# Patient Record
Sex: Male | Born: 1940 | Race: White | Hispanic: No | Smoking: Former smoker
Health system: Southern US, Community
[De-identification: ages and names within clinical notes are randomized; demographics above are authoritative.]

## PROBLEM LIST (undated history)

## (undated) DIAGNOSIS — C801 Malignant (primary) neoplasm, unspecified: Secondary | ICD-10-CM

## (undated) DIAGNOSIS — E785 Hyperlipidemia, unspecified: Secondary | ICD-10-CM

## (undated) DIAGNOSIS — H269 Unspecified cataract: Secondary | ICD-10-CM

## (undated) DIAGNOSIS — R413 Other amnesia: Secondary | ICD-10-CM

## (undated) HISTORY — PX: MELANOMA EXCISION: SHX5266

## (undated) HISTORY — DX: Malignant (primary) neoplasm, unspecified: C80.1

## (undated) HISTORY — DX: Hyperlipidemia, unspecified: E78.5

## (undated) HISTORY — DX: Unspecified cataract: H26.9

## (undated) HISTORY — DX: Other amnesia: R41.3

## (undated) HISTORY — PX: EYE SURGERY: SHX253

---

## 2002-07-21 ENCOUNTER — Ambulatory Visit (HOSPITAL_COMMUNITY): Admission: RE | Admit: 2002-07-21 | Discharge: 2002-07-21 | Payer: Self-pay | Admitting: Internal Medicine

## 2008-04-21 HISTORY — PX: EYE SURGERY: SHX253

## 2008-10-25 ENCOUNTER — Ambulatory Visit (HOSPITAL_COMMUNITY): Admission: RE | Admit: 2008-10-25 | Discharge: 2008-10-25 | Payer: Self-pay | Admitting: Ophthalmology

## 2008-12-14 ENCOUNTER — Ambulatory Visit (HOSPITAL_COMMUNITY): Admission: RE | Admit: 2008-12-14 | Discharge: 2008-12-14 | Payer: Self-pay | Admitting: Ophthalmology

## 2008-12-14 DIAGNOSIS — H269 Unspecified cataract: Secondary | ICD-10-CM

## 2008-12-14 HISTORY — DX: Unspecified cataract: H26.9

## 2010-07-28 LAB — HEMOGLOBIN AND HEMATOCRIT, BLOOD
HCT: 43.3 % (ref 39.0–52.0)
Hemoglobin: 15.6 g/dL (ref 13.0–17.0)

## 2010-07-28 LAB — BASIC METABOLIC PANEL
GFR calc non Af Amer: >60 mL/min (ref >60)
Glucose, Bld: 95 mg/dL (ref 70–99)
Potassium: 3.8 mEq/L (ref 3.5–5.1)
Sodium: 136 mEq/L (ref 135–145)

## 2010-09-06 NOTE — Op Note (Signed)
   NAME:  David Guerrero, David Guerrero                           ACCOUNT NO.:  [id number]   MEDICAL RECORD NO.:  [id number]                   PATIENT TYPE:  AMB   LOCATION:  DAY                                  FACILITY:  [facility]   PHYSICIAN:  R. Roetta Sessions, M.D.              DATE OF BIRTH:  May 21, 1940   DATE OF PROCEDURE:  07/21/2002  DATE OF DISCHARGE:                                 OPERATIVE REPORT   PROCEDURE:  Screening colonoscopy.   ENDOSCOPIST:  Gerrit Friends. Rourk, M.D.   INDICATIONS FOR PROCEDURE:  The patient is a 70 year old gentleman referred  for colorectal cancer screening.  He has never had a colonoscopy, although  he may have had a sigmoidoscopy in [place], [place] previously.  He is  devoid of any lower GI tract symptoms.  No family history of colorectal  neoplasia.  Colonoscopy is now being done as a standard screening maneuver.  This approach has been discussed with the patient at length.  The patient  potential risks, benefits and alternatives have been reviewed, questions  answered.  He is agreeable.  Please see the documentation in the medical  record.   MONITORING:  O2 saturation, blood pressure, pulse and respirations were  monitored throughout the entire procedure.  Conscious sedation: IV Versed and Demerol in incremental doses.   INSTRUMENT:  Olympus video chip colonoscope.   FINDINGS:  Digital rectal exam revealed no abnormalities.   ENDOSCOPIC FINDINGS:  The prep was good.   RECTUM:  Examination of the rectal mucosa including the retroflex view of  the anal verge revealed minimal internal hemorrhoids only.   COLON:  The colonic mucosa was surveyed from the rectosigmoid junction  through the left transverse and right colon to the area of the appendiceal  orifice, ileocecal valve, and cecum.  These structures were well seen and  photographed for the record.  The colonic mucosa to the cecum appeared  normal.   From the level of the cecum and ileocecal valve the  scope was slowly  withdrawn.  All previously mentioned mucosal surfaces were again seen; and,  again, no abnormalities were observed.  The patient tolerated the procedure  well and was reacted in endoscopy.    IMPRESSION:  1. Minimal internal hemorrhoids, otherwise normal rectum.  2. Normal colon.   RECOMMENDATIONS:  Repeat colonoscopy in 10 years.                                               Jonathon Bellows, M.D.    RMR/MEDQ  D:  07/21/2002  T:  07/21/2002  Job:  [id number]   cc:   [address]  [place]  [place] [address]  Fax: [phone]

## 2012-10-14 DIAGNOSIS — Z85828 Personal history of other malignant neoplasm of skin: Secondary | ICD-10-CM | POA: Insufficient documentation

## 2012-10-14 DIAGNOSIS — L570 Actinic keratosis: Secondary | ICD-10-CM | POA: Insufficient documentation

## 2013-10-17 DIAGNOSIS — L570 Actinic keratosis: Secondary | ICD-10-CM | POA: Diagnosis not present

## 2013-10-17 DIAGNOSIS — Z85820 Personal history of malignant melanoma of skin: Secondary | ICD-10-CM | POA: Diagnosis not present

## 2013-10-17 DIAGNOSIS — L738 Other specified follicular disorders: Secondary | ICD-10-CM | POA: Diagnosis not present

## 2013-10-17 DIAGNOSIS — L821 Other seborrheic keratosis: Secondary | ICD-10-CM | POA: Diagnosis not present

## 2013-10-17 DIAGNOSIS — D239 Other benign neoplasm of skin, unspecified: Secondary | ICD-10-CM | POA: Diagnosis not present

## 2013-10-17 DIAGNOSIS — I781 Nevus, non-neoplastic: Secondary | ICD-10-CM | POA: Diagnosis not present

## 2013-10-17 DIAGNOSIS — L819 Disorder of pigmentation, unspecified: Secondary | ICD-10-CM | POA: Diagnosis not present

## 2014-01-10 DIAGNOSIS — Z23 Encounter for immunization: Secondary | ICD-10-CM | POA: Diagnosis not present

## 2014-02-03 DIAGNOSIS — D692 Other nonthrombocytopenic purpura: Secondary | ICD-10-CM | POA: Diagnosis not present

## 2014-02-03 DIAGNOSIS — Z872 Personal history of diseases of the skin and subcutaneous tissue: Secondary | ICD-10-CM | POA: Diagnosis not present

## 2014-02-03 DIAGNOSIS — Z85820 Personal history of malignant melanoma of skin: Secondary | ICD-10-CM | POA: Diagnosis not present

## 2014-03-13 DIAGNOSIS — E784 Other hyperlipidemia: Secondary | ICD-10-CM | POA: Diagnosis not present

## 2014-03-13 DIAGNOSIS — Z79899 Other long term (current) drug therapy: Secondary | ICD-10-CM | POA: Diagnosis not present

## 2014-03-13 DIAGNOSIS — Z125 Encounter for screening for malignant neoplasm of prostate: Secondary | ICD-10-CM | POA: Diagnosis not present

## 2014-03-14 DIAGNOSIS — Z79899 Other long term (current) drug therapy: Secondary | ICD-10-CM | POA: Diagnosis not present

## 2014-03-14 DIAGNOSIS — Z125 Encounter for screening for malignant neoplasm of prostate: Secondary | ICD-10-CM | POA: Diagnosis not present

## 2014-03-14 DIAGNOSIS — E784 Other hyperlipidemia: Secondary | ICD-10-CM | POA: Diagnosis not present

## 2014-03-21 DIAGNOSIS — E785 Hyperlipidemia, unspecified: Secondary | ICD-10-CM | POA: Diagnosis not present

## 2014-03-21 DIAGNOSIS — Z0000 Encounter for general adult medical examination without abnormal findings: Secondary | ICD-10-CM | POA: Diagnosis not present

## 2014-08-29 DIAGNOSIS — H04123 Dry eye syndrome of bilateral lacrimal glands: Secondary | ICD-10-CM | POA: Diagnosis not present

## 2014-08-29 DIAGNOSIS — H26492 Other secondary cataract, left eye: Secondary | ICD-10-CM | POA: Diagnosis not present

## 2014-08-29 DIAGNOSIS — D3131 Benign neoplasm of right choroid: Secondary | ICD-10-CM | POA: Diagnosis not present

## 2014-08-29 DIAGNOSIS — Z961 Presence of intraocular lens: Secondary | ICD-10-CM | POA: Diagnosis not present

## 2014-10-30 DIAGNOSIS — D229 Melanocytic nevi, unspecified: Secondary | ICD-10-CM | POA: Insufficient documentation

## 2014-10-30 DIAGNOSIS — Z85828 Personal history of other malignant neoplasm of skin: Secondary | ICD-10-CM | POA: Diagnosis not present

## 2014-10-30 DIAGNOSIS — Z85820 Personal history of malignant melanoma of skin: Secondary | ICD-10-CM | POA: Diagnosis not present

## 2014-10-30 DIAGNOSIS — D239 Other benign neoplasm of skin, unspecified: Secondary | ICD-10-CM | POA: Diagnosis not present

## 2014-10-30 DIAGNOSIS — L570 Actinic keratosis: Secondary | ICD-10-CM | POA: Diagnosis not present

## 2014-11-15 ENCOUNTER — Telehealth: Payer: Self-pay | Admitting: Family Medicine

## 2014-11-15 NOTE — Telephone Encounter (Signed)
Patient has medicare and bcbs. Patient is currently on lipitor. Patient will call us back around the 8th of August since he would prefer to see Dr. Evette Doffing and her schedule is not open yet. Patient is able to schedule and is accepted.

## 2014-12-13 ENCOUNTER — Ambulatory Visit: Payer: Self-pay | Admitting: Pediatrics

## 2014-12-15 ENCOUNTER — Encounter: Payer: Self-pay | Admitting: Pediatrics

## 2014-12-15 ENCOUNTER — Ambulatory Visit (INDEPENDENT_AMBULATORY_CARE_PROVIDER_SITE_OTHER): Payer: Medicare Other | Admitting: Pediatrics

## 2014-12-15 ENCOUNTER — Encounter (INDEPENDENT_AMBULATORY_CARE_PROVIDER_SITE_OTHER): Payer: Self-pay

## 2014-12-15 VITALS — BP 151/76 | HR 53 | Temp 97.5°F | Ht 70.5 in | Wt 199.8 lb

## 2014-12-15 DIAGNOSIS — E785 Hyperlipidemia, unspecified: Secondary | ICD-10-CM | POA: Diagnosis not present

## 2014-12-15 DIAGNOSIS — Z0000 Encounter for general adult medical examination without abnormal findings: Secondary | ICD-10-CM | POA: Diagnosis not present

## 2014-12-15 DIAGNOSIS — Z136 Encounter for screening for cardiovascular disorders: Secondary | ICD-10-CM | POA: Diagnosis not present

## 2014-12-15 DIAGNOSIS — Z87891 Personal history of nicotine dependence: Secondary | ICD-10-CM | POA: Diagnosis not present

## 2014-12-15 DIAGNOSIS — Z125 Encounter for screening for malignant neoplasm of prostate: Secondary | ICD-10-CM | POA: Diagnosis not present

## 2014-12-15 DIAGNOSIS — IMO0001 Reserved for inherently not codable concepts without codable children: Secondary | ICD-10-CM

## 2014-12-15 DIAGNOSIS — Z5181 Encounter for therapeutic drug level monitoring: Secondary | ICD-10-CM

## 2014-12-15 DIAGNOSIS — R030 Elevated blood-pressure reading, without diagnosis of hypertension: Secondary | ICD-10-CM

## 2014-12-15 DIAGNOSIS — Z6828 Body mass index (BMI) 28.0-28.9, adult: Secondary | ICD-10-CM | POA: Diagnosis not present

## 2014-12-15 MED ORDER — ATORVASTATIN CALCIUM 40 MG PO TABS: 40.0000 mg | ORAL_TABLET | Freq: Every day | ORAL | Status: DC

## 2014-12-15 NOTE — Assessment & Plan Note (Deleted)
--  Colorectal Cancer Screening: has had normal colonoscopy sometime in last 10-12 years, he is not sure when or if he is due. --Zostavax: due, pt declined --Pneumococcal Vaccines: due --PSA: has had yearly, none elevated. No urinary symptoms, has a steady stream, complete bladder emptying. --AAA Screening: never done

## 2014-12-15 NOTE — Progress Notes (Signed)
Subjective:  Establish care  Pt Here for establish care.  Overall feeling well, no complaints. Only medicines are aspirin for primary prevention and atorvastatin for HLD. He was tried on several other statins in the past and this is the only one that has been effective, he denies side effects or myopathies with trial of other drugs.   He started walking daily 10 months ago, has walked over 1055mles since then, lost 20 lbs. Eating a balanced diet, high in fruits and vegetables. Minimizing red meats and controlling portion sizes. 0-1 glass of wine with dinner.  Sees a dermatologist regularly for skin checks. 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  SH: He lives at home with his wife who is in fairly good health, recent gall bladder surgery.  HCM --Colorectal Cancer Screening: has had normal colonoscopy at some point since moving here in 2003. He is not sure when or if he is due. --Zostavax: due, pt declined --Pneumococcal Vaccines: never had one, due for prevnar now --PSA: has had yearly, none elevated. No urinary symptoms, has a full steady stream, complete bladder emptying. --AAA Screening: never done, due 15-20 pack year history  Review of Systems  Constitutional: Negative for fever.       Purposeful weight loss  HENT: Negative for congestion and hearing loss.   Eyes: Negative for blurred vision.  Respiratory: Negative for cough, shortness of breath and wheezing.   Cardiovascular: Negative for chest pain, palpitations, orthopnea, claudication and leg swelling.  Gastrointestinal: Negative for abdominal pain, constipation and blood in stool.  Genitourinary: Negative for frequency.  Musculoskeletal: Negative for myalgias and falls.  Skin: Negative for rash.  Neurological: Negative for headaches.  Endo/Heme/Allergies: Does not bruise/bleed easily.  Psychiatric/Behavioral: Negative for depression.     Past Medical  History Patient Active Problem List   Diagnosis Date Noted  . Hyperlipidemia 12/15/2014  . Health care maintenance 12/15/2014    Medications- reviewed and updated Current Outpatient Prescriptions  Medication Sig Dispense Refill  . aspirin 81 MG tablet Take 81 mg by mouth daily.    .Marland Kitchenatorvastatin (LIPITOR) 40 MG tablet Take 1 tablet (40 mg total) by mouth daily at 6 PM. 90 tablet 3   No current facility-administered medications for this visit.    Objective: BP 151/76 mmHg  Pulse 53  Temp(Src) 97.5 F (36.4 C) (Oral)  Ht 5' 10.5" (1.791 m)  Wt 199 lb 12.8 oz (90.629 kg)  BMI 28.25 kg/m2 Gen: NAD, alert, cooperative with exam HEENT: NCAT, EOMI, TMs pearly gray b/l, OP without erythema CV: NRRR, normal S1/S2, no murmur, DP pulses 2+ b/l Resp: CTABL, no wheezes, normal WOB Abd: +BS, soft, NTND. no guarding or organomegaly Ext: No edema, warm Neuro: Alert and oriented, strength equal b/l UE and LE, coodrination grossly normal MSK: normal muscle bulk   Assessment/Plan:  73yoM with h/o HLD presents to establish care.   Hyperlipidemia Refill atorvastatin, check lipid profile and LFTs today.  -     Lipid panel -     atorvastatin (LIPITOR) 40 MG tablet; Take 1 tablet (40 mg total) by mouth daily at 6 PM.  Encounter for medication monitoring On atorvastatin, no LFTs in system. -     Hepatic function panel  Elevated BP Per pt usually 130s-140s/70s. No symptoms of elevated BP. -     BMP8+EGFR  Prostate cancer screening encounter, options and risks discussed Pt opted to not have  PSA screening this visit. Has had PSA tested in the past, no history of elevated values and pt with no BPH symptoms.  BMI 28.0-28.9,adult Lost 20 lbs over the past 10 months, walking several miles a day, healthy nutrition choices. Continue current exercise/nutrition.  HCM: --Colorectal Cancer Screening: obtain records --Zostavax: declined --Pneumococcal Vaccines: due, ordered --PSA: declined --AAA  Screening: due, 15-20 pack year history, ordered ultrasound  Other orders -     Pneumococcal conjugate vaccine 13-valent  [name], MD [facility] 12/15/2014, 12:24 PM

## 2014-12-15 NOTE — Assessment & Plan Note (Signed)
Refill atorvastatin, check lipid profile and LFTs today.

## 2014-12-16 LAB — BMP8+EGFR
BUN/Creatinine Ratio: 17 (ref 10–22)
BUN: 16 mg/dL (ref 8–27)
CALCIUM: 9.3 mg/dL (ref 8.6–10.2)
CHLORIDE: 102 mmol/L (ref 97–108)
CO2: 23 mmol/L (ref 18–29)
Creatinine, Ser: 0.93 mg/dL (ref 0.76–1.27)
GFR, EST AFRICAN AMERICAN: 93 mL/min/{1.73_m2} (ref >59)
GFR, EST NON AFRICAN AMERICAN: 81 mL/min/{1.73_m2} (ref >59)
Glucose: 89 mg/dL (ref 65–99)
Potassium: 3.9 mmol/L (ref 3.5–5.2)
Sodium: 141 mmol/L (ref 134–144)

## 2014-12-16 LAB — HEPATIC FUNCTION PANEL
ALBUMIN: 4.4 g/dL (ref 3.5–4.8)
ALT: 17 IU/L (ref 0–44)
AST: 23 IU/L (ref 0–40)
Alkaline Phosphatase: 48 IU/L (ref 39–117)
BILIRUBIN TOTAL: 0.9 mg/dL (ref 0.0–1.2)
Bilirubin, Direct: 0.22 mg/dL (ref 0.00–0.40)
Total Protein: 6.6 g/dL (ref 6.0–8.5)

## 2014-12-16 LAB — LIPID PANEL
Chol/HDL Ratio: 2.6 ratio units (ref 0.0–5.0)
Cholesterol, Total: 181 mg/dL (ref 100–199)
HDL: 69 mg/dL (ref >39)
LDL Calculated: 85 mg/dL (ref 0–99)
TRIGLYCERIDES: 137 mg/dL (ref 0–149)
VLDL CHOLESTEROL CAL: 27 mg/dL (ref 5–40)

## 2014-12-18 ENCOUNTER — Other Ambulatory Visit: Payer: Self-pay

## 2014-12-18 ENCOUNTER — Other Ambulatory Visit: Payer: Self-pay | Admitting: Pediatrics

## 2014-12-18 DIAGNOSIS — I714 Abdominal aortic aneurysm, without rupture, unspecified: Secondary | ICD-10-CM

## 2014-12-18 DIAGNOSIS — F17200 Nicotine dependence, unspecified, uncomplicated: Secondary | ICD-10-CM

## 2014-12-18 NOTE — Addendum Note (Signed)
Addended by: Eustaquio Maize on: 12/18/2014 12:49 PM   Modules accepted: Orders

## 2014-12-19 ENCOUNTER — Other Ambulatory Visit: Payer: Self-pay | Admitting: Nurse Practitioner

## 2014-12-19 DIAGNOSIS — E785 Hyperlipidemia, unspecified: Secondary | ICD-10-CM

## 2014-12-19 MED ORDER — ATORVASTATIN CALCIUM 40 MG PO TABS: 40.0000 mg | ORAL_TABLET | Freq: Every day | ORAL | Status: DC

## 2014-12-20 ENCOUNTER — Telehealth: Payer: Self-pay | Admitting: Pediatrics

## 2014-12-20 NOTE — Telephone Encounter (Signed)
Patient states he was not aware of needing an ultra sound to check for a AAA. Patient states that he does not want to have it done at this time.

## 2014-12-21 NOTE — Telephone Encounter (Signed)
I called him back to explain. We will defer AAA screen for now.

## 2014-12-27 ENCOUNTER — Ambulatory Visit (HOSPITAL_COMMUNITY): Admission: RE | Admit: 2014-12-27 | Payer: Self-pay | Source: Ambulatory Visit

## 2015-01-13 DIAGNOSIS — Z23 Encounter for immunization: Secondary | ICD-10-CM | POA: Diagnosis not present

## 2015-01-18 ENCOUNTER — Telehealth: Payer: Self-pay | Admitting: Pediatrics

## 2015-01-18 NOTE — Telephone Encounter (Signed)
David Guerrero spoke with patient.

## 2015-01-23 ENCOUNTER — Ambulatory Visit (INDEPENDENT_AMBULATORY_CARE_PROVIDER_SITE_OTHER): Payer: Medicare Other | Admitting: *Deleted

## 2015-01-23 ENCOUNTER — Telehealth: Payer: Self-pay | Admitting: Pediatrics

## 2015-01-23 DIAGNOSIS — Z23 Encounter for immunization: Secondary | ICD-10-CM

## 2015-01-23 NOTE — Patient Instructions (Signed)

## 2015-01-23 NOTE — Progress Notes (Signed)
Pt given Prevnar 13 IM LUOQ and tolerated well.

## 2015-04-30 DIAGNOSIS — Z87898 Personal history of other specified conditions: Secondary | ICD-10-CM | POA: Diagnosis not present

## 2015-04-30 DIAGNOSIS — D1730 Benign lipomatous neoplasm of skin and subcutaneous tissue of unspecified sites: Secondary | ICD-10-CM | POA: Diagnosis not present

## 2015-04-30 DIAGNOSIS — D229 Melanocytic nevi, unspecified: Secondary | ICD-10-CM | POA: Diagnosis not present

## 2015-04-30 DIAGNOSIS — I781 Nevus, non-neoplastic: Secondary | ICD-10-CM | POA: Diagnosis not present

## 2015-04-30 DIAGNOSIS — L821 Other seborrheic keratosis: Secondary | ICD-10-CM | POA: Diagnosis not present

## 2015-04-30 DIAGNOSIS — L570 Actinic keratosis: Secondary | ICD-10-CM | POA: Diagnosis not present

## 2015-04-30 DIAGNOSIS — L814 Other melanin hyperpigmentation: Secondary | ICD-10-CM | POA: Diagnosis not present

## 2015-05-14 ENCOUNTER — Ambulatory Visit (INDEPENDENT_AMBULATORY_CARE_PROVIDER_SITE_OTHER): Payer: Medicare Other | Admitting: Pediatrics

## 2015-05-14 ENCOUNTER — Encounter: Payer: Self-pay | Admitting: Pediatrics

## 2015-05-14 VITALS — BP 150/81 | HR 58 | Temp 97.3°F | Ht 70.5 in | Wt 204.4 lb

## 2015-05-14 DIAGNOSIS — J309 Allergic rhinitis, unspecified: Secondary | ICD-10-CM

## 2015-05-14 DIAGNOSIS — H6121 Impacted cerumen, right ear: Secondary | ICD-10-CM

## 2015-05-14 MED ORDER — FLUTICASONE PROPIONATE 50 MCG/ACT NA SUSP: 2.0000 | Freq: Every day | NASAL | Status: DC

## 2015-05-14 NOTE — Progress Notes (Signed)
    Subjective:    Patient ID: David Guerrero, male    DOB: 10-28-1940, 75 y.o.   MRN: [id number]  CC: ear stuffiness  HPI: David Guerrero is a 75 y.o. male presenting for ear problem  Ongoing for about a week Feels like both ears are stopped up Everything sounds a little fuzzy Has had chronic slightly runny nose for months No sneezing  HTN: 135/75 or so at home, never in the 150s  Depression screen [facility] 2/9 05/14/2015 12/15/2014  Decreased Interest 0 0  Down, Depressed, Hopeless 0 0  PHQ - 2 Score 0 0    Relevant past medical, surgical, family and social history reviewed and updated as indicated. Interim medical history since our last visit reviewed. Allergies and medications reviewed and updated.   ROS: Per HPI unless specifically indicated above  History  Smoking status  . Former Smoker -- 0.50 packs/day for 10 years  . Types: Cigarettes  . Quit date: 12/11/1978  Smokeless tobacco  . Never Used    Past Medical History Patient Active Problem List   Diagnosis Date Noted  . Hyperlipidemia 12/15/2014  . Health care maintenance 12/15/2014    Current Outpatient Prescriptions  Medication Sig Dispense Refill  . aspirin 81 MG tablet Take 81 mg by mouth daily.    Marland Kitchen atorvastatin (LIPITOR) 40 MG tablet Take 1 tablet (40 mg total) by mouth daily at 6 PM. 90 tablet 3  . fluticasone (FLONASE) 50 MCG/ACT nasal spray Place 2 sprays into both nostrils daily. 16 g 6   No current facility-administered medications for this visit.       Objective:    BP 150/81 mmHg  Pulse 58  Temp(Src) 97.3 F (36.3 C) (Oral)  Ht 5' 10.5" (1.791 m)  Wt 204 lb 6.4 oz (92.715 kg)  BMI 28.90 kg/m2  Wt Readings from Last 3 Encounters:  05/14/15 204 lb 6.4 oz (92.715 kg)  12/15/14 199 lb 12.8 oz (90.629 kg)    Gen: NAD, alert, cooperative with exam, NCAT EYES: EOMI, no scleral injection or icterus ENT:  L TM slightly dull, non-erythematous, slightly splayed LR, R TM with impacted cerumen.  After cerumen removal R ear slightly erythematous, slightly splayed LR. OP without erythema LYMPH: no cervical LAD CV: NRRR, normal S1/S2, no murmur, distal pulses 2+ b/l Resp: CTABL, no wheezes, normal WOB Abd: +BS, soft, NTND. no guarding or organomegaly Ext: No edema, warm Neuro: Alert and oriented, strength equal b/l UE and LE, coordination grossly normal MSK: normal muscle bulk     Assessment & Plan:    David Guerrero was seen today for ear pain.  Diagnoses and all orders for this visit:  Allergic rhinitis, unspecified allergic rhinitis type -     fluticasone (FLONASE) 50 MCG/ACT nasal spray; Place 2 sprays into both nostrils daily.  Impacted cerumen of right ear   Impacted cerumen removal: After procedure described to patient and patient agreed with proceeding, cerumen impaction was removed from both ears using irrigation. Pt tolerated procedure well.    Follow up plan: Return in about 6 months (around 11/11/2015).  [name], MD [facility] 05/14/2015, 2:27 PM

## 2015-05-14 NOTE — Patient Instructions (Signed)
flonase two sprays each side once a day

## 2015-06-11 ENCOUNTER — Telehealth: Payer: Self-pay | Admitting: Pediatrics

## 2015-06-11 DIAGNOSIS — J309 Allergic rhinitis, unspecified: Secondary | ICD-10-CM

## 2015-06-11 MED ORDER — TRIAMCINOLONE ACETONIDE 55 MCG/ACT NA AERO: 2.0000 | INHALATION_SPRAY | Freq: Every day | NASAL | Status: DC

## 2015-06-11 NOTE — Telephone Encounter (Signed)
flonase helped a lot, hates the taste, sent in nasacort.

## 2015-09-11 DIAGNOSIS — L03032 Cellulitis of left toe: Secondary | ICD-10-CM | POA: Diagnosis not present

## 2015-09-20 DIAGNOSIS — Z961 Presence of intraocular lens: Secondary | ICD-10-CM | POA: Diagnosis not present

## 2015-09-20 DIAGNOSIS — H04123 Dry eye syndrome of bilateral lacrimal glands: Secondary | ICD-10-CM | POA: Diagnosis not present

## 2015-09-20 DIAGNOSIS — H26492 Other secondary cataract, left eye: Secondary | ICD-10-CM | POA: Diagnosis not present

## 2015-09-20 DIAGNOSIS — D3131 Benign neoplasm of right choroid: Secondary | ICD-10-CM | POA: Diagnosis not present

## 2015-09-27 DIAGNOSIS — L03032 Cellulitis of left toe: Secondary | ICD-10-CM | POA: Diagnosis not present

## 2015-12-18 ENCOUNTER — Encounter: Payer: Self-pay | Admitting: Pediatrics

## 2015-12-18 ENCOUNTER — Ambulatory Visit (INDEPENDENT_AMBULATORY_CARE_PROVIDER_SITE_OTHER): Payer: Medicare Other | Admitting: Pediatrics

## 2015-12-18 VITALS — BP 126/72 | HR 66 | Temp 97.4°F | Ht 70.5 in | Wt 196.6 lb

## 2015-12-18 DIAGNOSIS — IMO0001 Reserved for inherently not codable concepts without codable children: Secondary | ICD-10-CM

## 2015-12-18 DIAGNOSIS — E663 Overweight: Secondary | ICD-10-CM | POA: Insufficient documentation

## 2015-12-18 DIAGNOSIS — Z0000 Encounter for general adult medical examination without abnormal findings: Secondary | ICD-10-CM

## 2015-12-18 DIAGNOSIS — E785 Hyperlipidemia, unspecified: Secondary | ICD-10-CM

## 2015-12-18 DIAGNOSIS — R030 Elevated blood-pressure reading, without diagnosis of hypertension: Secondary | ICD-10-CM

## 2015-12-18 DIAGNOSIS — Z1211 Encounter for screening for malignant neoplasm of colon: Secondary | ICD-10-CM

## 2015-12-18 MED ORDER — ATORVASTATIN CALCIUM 40 MG PO TABS: 40.0000 mg | ORAL_TABLET | Freq: Every day | ORAL | 3 refills | Status: DC

## 2015-12-18 NOTE — Progress Notes (Signed)
  Subjective:   Patient ID: David Guerrero, male    DOB: 12/03/1940, 75 y.o.   MRN: [id number] CC: Follow-up (1 year follow up)  HPI: [name] is a 75 y.o. male presenting for Follow-up (1 year follow up) and annual  Toenail that got infected, recently removed Growing back but much improved Was red around it, with some purulence  Still walking a lot, will be about 2052mles total this year No CP, no SOB No HA Watching what he eats, increasing fruits and veg  Memory: no problems with memory or concentration Falls: none   Relevant past medical, surgical, family and social history reviewed. Allergies and medications reviewed and updated. History  Smoking Status  . Former Smoker  . Packs/day: 0.50  . Years: 10.00  . Types: Cigarettes  . Quit date: 12/11/1978  Smokeless Tobacco  . Never Used   ROS: All systems negative other than what is in HPI   Objective:    BP 126/72 (BP Location: Right Arm, Patient Position: Sitting, Cuff Size: Normal)   Pulse 66   Temp 97.4 F (36.3 C) (Oral)   Ht 5' 10.5" (1.791 m)   Wt 196 lb 9.6 oz (89.2 kg)   BMI 27.81 kg/m   Wt Readings from Last 3 Encounters:  12/18/15 196 lb 9.6 oz (89.2 kg)  05/14/15 204 lb 6.4 oz (92.7 kg)  12/15/14 199 lb 12.8 oz (90.6 kg)    Gen: NAD, alert, cooperative with exam, NCAT EYES: EOMI, no conjunctival injection, or no icterus ENT:  TMs pearly gray b/l, OP without erythema LYMPH: no cervical LAD CV: NRRR, normal S1/S2, no murmur, distal pulses 2+ b/l Resp: CTABL, no wheezes, normal WOB Abd: +BS, soft, NTND. no guarding or organomegaly, no bruit Ext: No edema, warm Neuro: Alert and oriented, strength equal b/l UE and LE, coordination grossly normal MSK: normal muscle bulk  Assessment & Plan:  HKellonwas seen today for follow-up and annual.  Diagnoses and all orders for this visit:  Routine history and physical examination of adult  Hyperlipidemia -     BMP8+EGFR -     Lipid panel -      atorvastatin (LIPITOR) 40 MG tablet; Take 1 tablet (40 mg total) by mouth daily at 6 PM.  Screen for colon cancer Last colonoscopy in 2004, repeat in 10 yrs, over due No symptoms -     Ambulatory referral to Gastroenterology  Overweight (BMI 25.0-29.9) Cont lifestyle changes, walking apprx 6 mi a day, eating more fruits/veg  Elevated blood pressure regularly elevated in the past at office visits, though normal at home Normal here today, not on meds  Follow up plan: Return in 1 year (on 12/17/2016). CAssunta Found MD [facility]

## 2015-12-19 LAB — BMP8+EGFR
BUN/Creatinine Ratio: 17 (ref 10–24)
BUN: 16 mg/dL (ref 8–27)
CALCIUM: 9.4 mg/dL (ref 8.6–10.2)
CHLORIDE: 104 mmol/L (ref 96–106)
CO2: 24 mmol/L (ref 18–29)
CREATININE: 0.96 mg/dL (ref 0.76–1.27)
GFR calc Af Amer: 89 mL/min/{1.73_m2} (ref >59)
GFR calc non Af Amer: 77 mL/min/{1.73_m2} (ref >59)
GLUCOSE: 91 mg/dL (ref 65–99)
Potassium: 3.8 mmol/L (ref 3.5–5.2)
Sodium: 143 mmol/L (ref 134–144)

## 2015-12-19 LAB — LIPID PANEL
CHOL/HDL RATIO: 2.8 ratio (ref 0.0–5.0)
CHOLESTEROL TOTAL: 147 mg/dL (ref 100–199)
HDL: 52 mg/dL (ref >39)
LDL CALC: 70 mg/dL (ref 0–99)
TRIGLYCERIDES: 126 mg/dL (ref 0–149)
VLDL CHOLESTEROL CAL: 25 mg/dL (ref 5–40)

## 2015-12-20 DIAGNOSIS — Z23 Encounter for immunization: Secondary | ICD-10-CM | POA: Diagnosis not present

## 2016-04-23 ENCOUNTER — Ambulatory Visit (INDEPENDENT_AMBULATORY_CARE_PROVIDER_SITE_OTHER): Payer: Medicare Other | Admitting: Pediatrics

## 2016-04-23 ENCOUNTER — Encounter: Payer: Self-pay | Admitting: Pediatrics

## 2016-04-23 VITALS — BP 173/82 | HR 57 | Temp 98.2°F | Ht 70.5 in | Wt 204.8 lb

## 2016-04-23 DIAGNOSIS — R030 Elevated blood-pressure reading, without diagnosis of hypertension: Secondary | ICD-10-CM | POA: Diagnosis not present

## 2016-04-23 DIAGNOSIS — M65351 Trigger finger, right little finger: Secondary | ICD-10-CM

## 2016-04-23 NOTE — Progress Notes (Signed)
  Subjective:   Patient ID: David Guerrero, male    DOB: 01-25-1941, 76 y.o.   MRN: [id number] CC: Trouble bending right little finger  HPI: David Guerrero is a 76 y.o. male presenting for Trouble bending right little finger  Ongoing for a couple of weeks Getting harder to bend finger Feels a snapping at times Has pain Never had a trigger finger before No other fingers affected Cant make fist completley b/l  HTN Had a caramel macchiato right before coming in Doesn't think BP is usually this high  Relevant past medical, surgical, family and social history reviewed. Allergies and medications reviewed and updated. History  Smoking Status  . Former Smoker  . Packs/day: 0.50  . Years: 10.00  . Types: Cigarettes  . Quit date: 12/11/1978  Smokeless Tobacco  . Never Used   ROS: Per HPI   Objective:    BP (!) 173/82   Pulse (!) 57   Temp 98.2 F (36.8 C) (Oral)   Ht 5' 10.5" (1.791 m)   Wt 204 lb 12.8 oz (92.9 kg)   BMI 28.97 kg/m   Wt Readings from Last 3 Encounters:  04/23/16 204 lb 12.8 oz (92.9 kg)  12/18/15 196 lb 9.6 oz (89.2 kg)  05/14/15 204 lb 6.4 oz (92.7 kg)    Gen: NAD, alert, cooperative with exam, NCAT EYES: EOMI, no conjunctival injection, or no icterus CV: WWP, distal pulses 2+ b/l Resp: normal WOB Neuro: Alert and oriented MSK: R 5th finger with flexion not able to completely bend, small nodularity palpated along tendon just proximal to MCP  Assessment & Plan:  David Guerrero was seen today for trouble bending right little finger.  Diagnoses and all orders for this visit:  Trigger little finger of right hand Discussed options, agreed to proceed with trigger finger injection, see below  Elevated blood pressure reading Check at home, let me know if remains elevated wnl last visit  PROCEDURE: Trigger thumb injection: Verbal consent was obtained from the patient. Risks including infection, bleeding explained and contrasted with benefits and alternatives.  Area cleaned with alcohol pads. R 5th finger flexor tendon injected. The patient tolerated the procedure well. No complications. Injection: 0.54mL bupivacaine and Kenalog 40 mg. Needle: 27 gauge   Follow up plan: prn [name], MD [facility]

## 2016-04-30 DIAGNOSIS — Z85820 Personal history of malignant melanoma of skin: Secondary | ICD-10-CM | POA: Diagnosis not present

## 2016-04-30 DIAGNOSIS — Z872 Personal history of diseases of the skin and subcutaneous tissue: Secondary | ICD-10-CM | POA: Diagnosis not present

## 2016-04-30 DIAGNOSIS — D229 Melanocytic nevi, unspecified: Secondary | ICD-10-CM | POA: Diagnosis not present

## 2016-04-30 DIAGNOSIS — L814 Other melanin hyperpigmentation: Secondary | ICD-10-CM | POA: Diagnosis not present

## 2016-04-30 DIAGNOSIS — L821 Other seborrheic keratosis: Secondary | ICD-10-CM | POA: Diagnosis not present

## 2016-04-30 DIAGNOSIS — Z86008 Personal history of in-situ neoplasm of other site: Secondary | ICD-10-CM | POA: Diagnosis not present

## 2016-04-30 DIAGNOSIS — I781 Nevus, non-neoplastic: Secondary | ICD-10-CM | POA: Diagnosis not present

## 2016-04-30 DIAGNOSIS — E882 Lipomatosis, not elsewhere classified: Secondary | ICD-10-CM | POA: Diagnosis not present

## 2016-09-25 DIAGNOSIS — D3131 Benign neoplasm of right choroid: Secondary | ICD-10-CM | POA: Diagnosis not present

## 2016-09-25 DIAGNOSIS — Z961 Presence of intraocular lens: Secondary | ICD-10-CM | POA: Diagnosis not present

## 2016-09-25 DIAGNOSIS — H04123 Dry eye syndrome of bilateral lacrimal glands: Secondary | ICD-10-CM | POA: Diagnosis not present

## 2016-09-25 DIAGNOSIS — H26492 Other secondary cataract, left eye: Secondary | ICD-10-CM | POA: Diagnosis not present

## 2017-01-06 DIAGNOSIS — Z23 Encounter for immunization: Secondary | ICD-10-CM | POA: Diagnosis not present

## 2017-01-18 ENCOUNTER — Other Ambulatory Visit: Payer: Self-pay | Admitting: Pediatrics

## 2017-01-18 DIAGNOSIS — E785 Hyperlipidemia, unspecified: Secondary | ICD-10-CM

## 2017-01-28 ENCOUNTER — Ambulatory Visit (INDEPENDENT_AMBULATORY_CARE_PROVIDER_SITE_OTHER): Payer: Medicare Other | Admitting: Family Medicine

## 2017-01-28 ENCOUNTER — Encounter: Payer: Self-pay | Admitting: Family Medicine

## 2017-01-28 VITALS — BP 140/74 | HR 71 | Temp 97.9°F | Ht 70.0 in | Wt 195.0 lb

## 2017-01-28 DIAGNOSIS — Z23 Encounter for immunization: Secondary | ICD-10-CM | POA: Diagnosis not present

## 2017-01-28 DIAGNOSIS — L03011 Cellulitis of right finger: Secondary | ICD-10-CM | POA: Diagnosis not present

## 2017-01-28 MED ORDER — DOXYCYCLINE HYCLATE 100 MG PO TABS: 100.0000 mg | ORAL_TABLET | Freq: Two times a day (BID) | ORAL | 0 refills | Status: DC

## 2017-01-28 NOTE — Addendum Note (Signed)
Addended byCarrolyn Leigh on: 01/28/2017 04:30 PM   Modules accepted: Orders

## 2017-01-28 NOTE — Progress Notes (Signed)
   Subjective: CC: Finger infection PCP: Eustaquio Maize, MD David Guerrero is a 76 y.o. male presenting to clinic today for:  1. Concern for finger infection Patient reports about 4-5 days ago, he was cleaning and avocado kitchen tool when one of the blades punctured the side of his nail bed. He reports that he has gradually gotten increased swelling, pain on the lateral aspect of his right thumb. He also is noticing what appears to be purulence building underneath the skin. No fevers, chills, joint pain. Of note, he reports that his wife is currently being treated with chemotherapy for a bone cancer and he is worried about potentially infecting her. He has been covering his hand up with a glove and multiple Band-Aids to prevent possible transmission of any infection.  No Known Allergies Past Medical History:  Diagnosis Date  . Cancer ([place])    melanoma, removed apprx 2013, early stage per pt  . Cataract   . Hyperlipidemia    Family History  Problem Relation Age of Onset  . Arthritis Mother   . Cancer Father    Social Hx: Non- smoker.Current medications reviewed.    Health Maintenance: Flu, TDap   ROS: Per HPI  Objective: Office vital signs reviewed. BP 140/74   Pulse 71   Temp 97.9 F (36.6 C) (Oral)   Ht 5\' 10"  (1.778 m)   Wt 195 lb (88.5 kg)   BMI 27.98 kg/m   Physical Examination:  General: Awake, alert, well nourished, No acute distress Hand: He has full active range of motion of all fingers.  Right thumb with inflammation, increased redness and swelling along the ulnar aspect of the nail bed. There is tenderness to palpation. No purulence able to be expressed. Fluctuance palpable. Neuro: Light touch sensation intact. Skin: as above.  Procedure Incision and drainage: Informed consent provided. Written consent obtained. See chart. The ulnar aspect of the right thumb nail bed was identified and cleaned with alcohol swabs 2. Topical anesthetic with ethyl  chloride was applied. A 25-gauge needle was used to make a stab incision. Less than 1 cc of blood loss. Minimal prolapse expressed from lesion. Hemostasis was achieved with pressure. Patient tolerated procedure well. There were no immediate complications. A Band-Aid was applied. Instructions for home care were discussed and provided to the patient.  Assessment/ Plan: 76 y.o. male   1. Paronychia of right thumb No evidence of felon on exam. I suspect that he has a soft tissue infection secondary to recent puncture with the kitchen tool. As there appears to be palpable purulence, I&D was performed. See above procedure. He was also discharged on oral antibiotics. His tetanus is not up-to-date and was recommended today. Homecare structures were reviewed. Patient was given understanding. He was discharged in stable condition. He'll follow up as needed. - doxycycline (VIBRA-TABS) 100 MG tablet; Take 1 tablet (100 mg total) by mouth 2 (two) times daily.  Dispense: 20 tablet; Refill: 0   No orders of the defined types were placed in this encounter.  Meds ordered this encounter  Medications  . doxycycline (VIBRA-TABS) 100 MG tablet    Sig: Take 1 tablet (100 mg total) by mouth 2 (two) times daily.    Dispense:  20 tablet    Refill:  [place], DO [place] [phone]

## 2017-01-28 NOTE — Patient Instructions (Signed)
Fingertip Infection  There are two main types of fingertip infections:   Paronychia. This is an infection that happens around your nail. This type of infection can start suddenly in one nail or occur gradually over time and affect more than one nail. Long-term (chronic) paronychia can make your fingernails thick and deformed.   Felon. This is a bacterial infection in the padded tip of your finger. Felon infection can cause a painful collection of pus (abscess) to form inside your fingertip. If the infection is not treated, the infection can spread as deep as the bone.    What are the causes?  Paronychia infection can be caused by bacteria, funguses, or a mix of both. Felon infection is usually caused by the bacteria that are normally found on your skin. An infection can develop if the bacteria spread through your skin to the pad of tissue inside your fingertip.  What increases the risk?  A fingertip infection is more likely to develop in people:   Who have diabetes.   Who have a weak body defense (immune) system.   Who work with their hands.   Whose hands are exposed to moisture, chemicals, or irritants for long periods of time.   Who have poor circulation.   Who bite, chew, or pick their fingernails.    What are the signs or symptoms?  Symptoms of paronychia may affect one or more fingernails and include:   Pain, swelling, and redness around the nail.   Pus-filled pockets at the base or side of the fingernail (cuticle).   Thick fingernails that separate from the nail bed.   Pus that drains from the nail bed.    Symptoms of felon usually affect just one fingertip pad and include:   Severe, throbbing pain.   Redness.   Swelling.   Warmth.   Tenderness when the affected fingertip is touched.    How is this diagnosed?  A fingertip infection is diagnosed with a medical history and physical exam. If there is pus draining from the infection, it may be swabbed and sent to the lab for a culture. An X-ray  may be done to see if the infection has spread to the bone.  How is this treated?  Treatment for a fingertip infection may include:   Warm water or salt-water soaks several times per day.   Antibiotic medicine. This may be an ointment or pills.   Steroid ointment.   Antifungal pills.   Drainage of pus pockets. This is done by making a surgical cut (incision) to open the fingertip to drain pus.   Wearing gloves to protect your nails.    Follow these instructions at home:  Medicines   Take or apply over-the-counter and prescription medicines only as told by your health care provider.   If you were prescribed antibiotic medicine, take or apply it as told by your health care provider. Do not stop using the antibiotic even if your condition improves.  Wound care   Clean the infected area each day with warm water or salt water, or as told by your health care provider.  ? Gently wash the infected area with mild soap and water.  ? Rinse the infected area with water to remove all soap.  ? Pat the infected area dry with a clean towel. Do not rub it.  ? To make a salt-water mixture, completely dissolve -1 tsp of salt in 1 cup of warm water.   Follow instructions from your health care provider   Warmth. ? A bad smell. General instructions  Keep the dressing dry until your health care provider says it can be removed. Do not take baths, swim, use a hot tub, or do anything that would put your wound underwater until your health care provider approves.  Raise (elevate) the infected area above the level of your heart while you are sitting or lying down or as told by your health care provider.  Do not scratch or pick at the  infection.  Wear gloves as told by your health care provider, if this applies.  Keep all follow-up visits as told by your health care provider. This is important. How is this prevented?  Wear gloves when you work with your hands.  Wash your hands often with antibacterial soap.  Avoid letting your hands stay wet or irritated for long periods of time.  Do not bite your fingernails. Do not pull on your cuticles. Do not suck on your fingers.  Use clean scissors or nail clippers to trim your nails. Do not cut your fingernails very short. Contact a health care provider if:  Your pain medicine is not helping.  You have more redness, swelling, or pain at your fingertip.  You continue to have fluid, blood, or pus coming from your fingertip.  Your infection area feels warm to the touch.  You continue to notice a bad smell coming from your fingertip or your dressing. Get help right away if:  The area of redness is spreading, or you notice a red streak going away from your fingertip.  You have a fever. This information is not intended to replace advice given to you by your health care provider. Make sure you discuss any questions you have with your health care provider. Document Released: 05/15/2004 Document Revised: 09/13/2015 Document Reviewed: 09/25/2014 Elsevier Interactive Patient Education  2018 [place].   Incision and Drainage, Care After Refer to this sheet in the next few weeks. These instructions provide you with information about caring for yourself after your procedure. Your health care provider may also give you more specific instructions. Your treatment has been planned according to current medical practices, but problems sometimes occur. Call your health care provider if you have any problems or questions after your procedure. What can I expect after the procedure? After the procedure, it is common to have:  Pain or discomfort around your incision site.  Drainage  from your incision.  Follow these instructions at home:  Take over-the-counter and prescription medicines only as told by your health care provider.  If you were prescribed an antibiotic medicine, take it as told by your health care provider.Do not stop taking the antibiotic even if you start to feel better.  Followinstructions from your health care provider about: ? How to take care of your incision. ? When and how you should change your packing and bandage (dressing). Wash your hands with soap and water before you change your dressing. If soap and water are not available, use hand sanitizer. ? When you should remove your dressing.  Do not take baths, swim, or use a hot tub until your health care provider approves.  Keep all follow-up visits as told by your health care provider. This is important.  Check your incision area every day for signs of infection. Check for: ? More redness, swelling, or pain. ? More fluid or blood. ? Warmth. ? Pus or a bad smell. Contact a health care provider if:  Your cyst or abscess returns.  You have  a fever.  You have more redness, swelling, or pain around your incision.  You have more fluid or blood coming from your incision.  Your incision feels warm to the touch.  You have pus or a bad smell coming from your incision. Get help right away if:  You have severe pain or bleeding.  You cannot eat or drink without vomiting.  You have decreased urine output.  You become short of breath.  You have chest pain.  You cough up blood.  The area where the incision and drainage occurred becomes numb or it tingles. This information is not intended to replace advice given to you by your health care provider. Make sure you discuss any questions you have with your health care provider. Document Released: 06/30/2011 Document Revised: 09/07/2015 Document Reviewed: 01/26/2015 Elsevier Interactive Patient Education  2017 [employer].

## 2017-04-18 ENCOUNTER — Other Ambulatory Visit: Payer: Self-pay | Admitting: Family Medicine

## 2017-04-18 DIAGNOSIS — E785 Hyperlipidemia, unspecified: Secondary | ICD-10-CM

## 2017-05-04 DIAGNOSIS — D229 Melanocytic nevi, unspecified: Secondary | ICD-10-CM | POA: Diagnosis not present

## 2017-05-04 DIAGNOSIS — Z872 Personal history of diseases of the skin and subcutaneous tissue: Secondary | ICD-10-CM | POA: Diagnosis not present

## 2017-05-04 DIAGNOSIS — Z1283 Encounter for screening for malignant neoplasm of skin: Secondary | ICD-10-CM | POA: Diagnosis not present

## 2017-05-04 DIAGNOSIS — Z85820 Personal history of malignant melanoma of skin: Secondary | ICD-10-CM | POA: Diagnosis not present

## 2017-05-04 DIAGNOSIS — C44622 Squamous cell carcinoma of skin of right upper limb, including shoulder: Secondary | ICD-10-CM | POA: Diagnosis not present

## 2017-05-04 DIAGNOSIS — Z86008 Personal history of in-situ neoplasm of other site: Secondary | ICD-10-CM | POA: Diagnosis not present

## 2017-05-04 DIAGNOSIS — D485 Neoplasm of uncertain behavior of skin: Secondary | ICD-10-CM | POA: Insufficient documentation

## 2017-05-04 DIAGNOSIS — Z85828 Personal history of other malignant neoplasm of skin: Secondary | ICD-10-CM | POA: Diagnosis not present

## 2017-06-05 DIAGNOSIS — H04123 Dry eye syndrome of bilateral lacrimal glands: Secondary | ICD-10-CM | POA: Diagnosis not present

## 2017-06-05 DIAGNOSIS — D3131 Benign neoplasm of right choroid: Secondary | ICD-10-CM | POA: Diagnosis not present

## 2017-06-05 DIAGNOSIS — Z961 Presence of intraocular lens: Secondary | ICD-10-CM | POA: Diagnosis not present

## 2017-06-05 DIAGNOSIS — H26492 Other secondary cataract, left eye: Secondary | ICD-10-CM | POA: Diagnosis not present

## 2017-10-14 ENCOUNTER — Other Ambulatory Visit: Payer: Self-pay | Admitting: Pediatrics

## 2017-10-14 DIAGNOSIS — E785 Hyperlipidemia, unspecified: Secondary | ICD-10-CM

## 2017-11-26 ENCOUNTER — Ambulatory Visit (INDEPENDENT_AMBULATORY_CARE_PROVIDER_SITE_OTHER): Payer: Medicare Other

## 2017-11-26 VITALS — BP 166/58 | HR 53 | Temp 97.4°F | Ht 70.0 in | Wt 198.0 lb

## 2017-11-26 DIAGNOSIS — Z0000 Encounter for general adult medical examination without abnormal findings: Secondary | ICD-10-CM | POA: Diagnosis not present

## 2017-11-26 DIAGNOSIS — E785 Hyperlipidemia, unspecified: Secondary | ICD-10-CM

## 2017-11-26 NOTE — Patient Instructions (Signed)
  David Guerrero , Thank you for taking time to come for your Medicare Wellness Visit. I appreciate your ongoing commitment to your health goals. Please review the following plan we discussed and let me know if I can assist you in the future.   These are the goals we discussed: Goals    . DIET - EAT MORE FRUITS AND VEGETABLES       This is a list of the screening recommended for you and due dates:  Health Maintenance  Topic Date Due  . Flu Shot  11/19/2017  . Tetanus Vaccine  01/29/2027  . Pneumonia vaccines  Completed

## 2017-11-26 NOTE — Progress Notes (Signed)
Subjective:   David Guerrero is a 77 y.o. male who presents for Medicare Annual/Subsequent preventive examination. He currently resides in [place]. His wife of 1 years passed away this past 2022/07/09 with cancer so he currently lives alone. 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 He has a Buyer, retail in [employer]. He enjoys golfing and he has a group that golfs together on a regular, he goes out to eat with friends often, and is very involved in his church. He has a regular exercise routine and averages about 7.8 miles of walking per day. He has stairs in his home that go down to his home office but he has no trouble going up and down them. He has no pets at home. He enjoys traveling with his daughter and grandchildren and just recently returned from a trip to [place].  Review of Systems:   Cardiac Risk Factors include: advanced age (>28men, >49 women);male gender     Objective:    Vitals: BP (!) 166/58   Pulse (!) 53   Temp (!) 97.4 F (36.3 C) (Oral)   Ht 5\' 10"  (1.778 m)   Wt 198 lb (89.8 kg)   BMI 28.41 kg/m   Body mass index is 28.41 kg/m.  Advanced Directives 11/26/2017  Does Patient Have a Medical Advance Directive? Yes  Type of Advance Directive Living will  Does patient want to make changes to medical advance directive? No - Patient declined   Patient states that he has a living will in place.   Tobacco Social History   Tobacco Use  Smoking Status Former Smoker  . Packs/day: 0.50  . Years: 10.00  . Pack years: 5.00  . Types: Cigarettes  . Last attempt to quit: 12/11/1978  . Years since quitting: 38.9  Smokeless Tobacco Never Used     Counseling given: Not Answered   Clinical Intake:  Pre-visit preparation completed: No  Pain : No/denies pain     BMI - recorded: 27.98 Nutritional Status: BMI 25 -29 Overweight Nutritional Risks: None Diabetes: No  How often do you need to have someone help you when you  read instructions, pamphlets, or other written materials from your doctor or pharmacy?: 1 - Never What is the last grade level you completed in school?: Bachelor of Science degree in business  Interpreter Needed?: No  Information entered by :: David Guerrero  Past Medical History:  Diagnosis Date  . Cancer ([place])    melanoma, removed apprx 2013, early stage per pt  . Cataract 12/14/2008  . Hyperlipidemia    Past Surgical History:  Procedure Laterality Date  . EYE SURGERY     Cataracts  . MELANOMA EXCISION     L upper arm   Family History  Problem Relation Age of Onset  . Arthritis Mother   . Cancer Father    Social History   Socioeconomic History  . Marital status: Widowed    Spouse name: Not on file  . Number of children: 1  . Years of education: Not on file  . Highest education level: Bachelor's degree (e.g., BA, AB, BS)  Occupational History  . Occupation: Retired  . Occupation: Self Employed    Comment: [phone]  Social Needs  . Financial resource strain: Not hard at all  . Food insecurity:    Worry: Never true    Inability: Never true  . Transportation needs:    Medical: No  Non-medical: No  Tobacco Use  . Smoking status: Former Smoker    Packs/day: 0.50    Years: 10.00    Pack years: 5.00    Types: Cigarettes    Last attempt to quit: 12/11/1978    Years since quitting: 38.9  . Smokeless tobacco: Never Used  Substance and Sexual Activity  . Alcohol use: Yes    Alcohol/week: 2.0 - 3.0 standard drinks    Types: 2 - 3 Glasses of wine per week  . Drug use: No  . Sexual activity: Not Currently  Lifestyle  . Physical activity:    Days per week: 5 days    Minutes per session: 60 min  . Stress: Not at all  Relationships  . Social connections:    Talks on phone: More than three times a week    Gets together: More than three times a week    Attends religious service: More than 4 times per year    Active member of club or organization: Yes     Attends meetings of clubs or organizations: 1 to 4 times per year    Relationship status: Widowed  Other Topics Concern  . Not on file  Social History Narrative  . Not on file    Outpatient Encounter Medications as of 11/26/2017  Medication Sig  . aspirin 81 MG tablet Take 81 mg by mouth daily.  Marland Kitchen atorvastatin (LIPITOR) 40 MG tablet TAKE 1 TABLET (40 MG TOTAL) BY MOUTH DAILY AT 6 PM.  . [DISCONTINUED] doxycycline (VIBRA-TABS) 100 MG tablet Take 1 tablet (100 mg total) by mouth 2 (two) times daily.   No facility-administered encounter medications on file as of 11/26/2017.     Activities of Daily Living In your present state of health, do you have any difficulty performing the following activities: 11/26/2017  Hearing? N  Vision? Y  Comment Wears reading glasses  Difficulty concentrating or making decisions? N  Walking or climbing stairs? N  Dressing or bathing? N  Doing errands, shopping? N  Preparing Food and eating ? N  Using the Toilet? N  In the past six months, have you accidently leaked urine? N  Do you have problems with loss of bowel control? N  Managing your Medications? N  Managing your Finances? N  Housekeeping or managing your Housekeeping? N  Some recent data might be hidden    Patient Care Team: Eustaquio Maize, MD as PCP - General (Pediatrics) Loney Loh, MD (Dermatology)   Assessment:   This is a routine wellness examination for [name].  Exercise Activities and Dietary recommendations  Patient currently walks daily. He averages around 7.8 miles daily.  Goals    . DIET - EAT MORE FRUITS AND VEGETABLES       Fall Risk Fall Risk  11/26/2017 01/28/2017 04/23/2016 12/18/2015 05/14/2015  Falls in the past year? No No No No No   Is the patient's home free of loose throw rugs in walkways, pet beds, electrical cords, etc?   yes      Grab bars in the bathroom? no      Handrails on the stairs?   yes      Adequate lighting?   yes    Depression Screen PHQ  2/9 Scores 11/26/2017 01/28/2017 04/23/2016 12/18/2015  PHQ - 2 Score 0 0 0 0    Cognitive Function MMSE - Mini Mental State Exam 11/26/2017  Orientation to time 5  Orientation to Place 5  Registration 3  Attention/ Calculation 5  Recall 3  Language- name 2 objects 2  Language- repeat 1  Language- follow 3 step command 3  Language- read & follow direction 1  Write a sentence 1  Copy design 1  Total score 30    Patient has no trouble with memory loss    Immunization History  Administered Date(s) Administered  . Influenza, High Dose Seasonal PF 01/13/2015, 12/20/2015  . Influenza-Unspecified 01/06/2017  . Pneumococcal Conjugate-13 01/23/2015  . Pneumococcal Polysaccharide-23 06/14/2007  . Td 01/28/2017    Qualifies for Shingles Vaccine? Patient declines at this time  Screening Tests Health Maintenance  Topic Date Due  . INFLUENZA VACCINE  11/19/2017  . TETANUS/TDAP  01/29/2027  . PNA vac Low Risk Adult  Completed   Cancer Screenings: Lung: Low Dose CT Chest recommended if Age 66-80 years, 30 pack-year currently smoking OR have quit w/in 15years. Patient does not qualify. Colorectal: Patient has never had a colonoscopy and states he has never had any problems.   Additional Screenings:  Hepatitis C Screening: Does not fall in the guidelines for this test      Plan:     I have personally reviewed and noted the following in the patient's chart:   . Medical and social history . Use of alcohol, tobacco or illicit drugs  . Current medications and supplements . Functional ability and status . Nutritional status . Physical activity . Advanced directives . List of other physicians . Hospitalizations, surgeries, and ER visits in previous 12 months . Vitals . Screenings to include cognitive, depression, and falls . Referrals and appointments  In addition, I have reviewed and discussed with patient certain preventive protocols, quality metrics, and best practice  recommendations. A written personalized care plan for preventive services as well as general preventive health recommendations were provided to patient.     Rolena Infante, Guerrero  05/30/8335

## 2017-11-27 LAB — CMP14+EGFR
A/G RATIO: 1.6 (ref 1.2–2.2)
ALT: 12 IU/L (ref 0–44)
AST: 20 IU/L (ref 0–40)
Albumin: 3.9 g/dL (ref 3.5–4.8)
Alkaline Phosphatase: 53 IU/L (ref 39–117)
BILIRUBIN TOTAL: 0.6 mg/dL (ref 0.0–1.2)
BUN/Creatinine Ratio: 19 (ref 10–24)
BUN: 16 mg/dL (ref 8–27)
CHLORIDE: 106 mmol/L (ref 96–106)
CO2: 22 mmol/L (ref 20–29)
Calcium: 9.4 mg/dL (ref 8.6–10.2)
Creatinine, Ser: 0.83 mg/dL (ref 0.76–1.27)
GFR calc Af Amer: 98 mL/min/{1.73_m2} (ref >59)
GFR calc non Af Amer: 85 mL/min/{1.73_m2} (ref >59)
GLUCOSE: 89 mg/dL (ref 65–99)
Globulin, Total: 2.4 g/dL (ref 1.5–4.5)
POTASSIUM: 4 mmol/L (ref 3.5–5.2)
Sodium: 141 mmol/L (ref 134–144)
Total Protein: 6.3 g/dL (ref 6.0–8.5)

## 2017-11-27 LAB — LIPID PANEL
CHOL/HDL RATIO: 2.9 ratio (ref 0.0–5.0)
Cholesterol, Total: 145 mg/dL (ref 100–199)
HDL: 50 mg/dL (ref >39)
LDL CALC: 68 mg/dL (ref 0–99)
TRIGLYCERIDES: 134 mg/dL (ref 0–149)
VLDL Cholesterol Cal: 27 mg/dL (ref 5–40)

## 2017-11-30 ENCOUNTER — Ambulatory Visit (INDEPENDENT_AMBULATORY_CARE_PROVIDER_SITE_OTHER): Payer: Medicare Other | Admitting: *Deleted

## 2017-11-30 DIAGNOSIS — R030 Elevated blood-pressure reading, without diagnosis of hypertension: Secondary | ICD-10-CM

## 2017-11-30 NOTE — Progress Notes (Signed)
24 hr Ambulatory BP monitor placed per order of Dr Evette Doffing. Pt had an AWV and BP was elevated. Pt voices understanding if monitor and will return in the am to have it removed

## 2017-12-01 DIAGNOSIS — M79676 Pain in unspecified toe(s): Secondary | ICD-10-CM | POA: Diagnosis not present

## 2017-12-01 DIAGNOSIS — L03031 Cellulitis of right toe: Secondary | ICD-10-CM | POA: Diagnosis not present

## 2017-12-02 ENCOUNTER — Encounter: Payer: Self-pay | Admitting: Pediatrics

## 2017-12-02 ENCOUNTER — Ambulatory Visit (INDEPENDENT_AMBULATORY_CARE_PROVIDER_SITE_OTHER): Payer: Medicare Other | Admitting: Pediatrics

## 2017-12-02 VITALS — BP 146/77 | HR 62 | Temp 98.9°F | Ht 70.0 in | Wt 194.0 lb

## 2017-12-02 DIAGNOSIS — I10 Essential (primary) hypertension: Secondary | ICD-10-CM

## 2017-12-02 DIAGNOSIS — E785 Hyperlipidemia, unspecified: Secondary | ICD-10-CM

## 2017-12-02 MED ORDER — AMLODIPINE BESYLATE 2.5 MG PO TABS: 2.5000 mg | ORAL_TABLET | Freq: Every day | ORAL | 3 refills | Status: DC

## 2017-12-02 NOTE — Progress Notes (Signed)
  Subjective:   Patient ID: David Guerrero, male    DOB: Apr 19, 1941, 77 y.o.   MRN: [id number] CC: Hypertension  HPI: David Guerrero is a 77 y.o. male   Here for follow-up elevated blood pressure.  Recently had annual wellness visit.  Blood pressure noted to be elevated then.  He came back for placement of ambulatory blood pressure monitoring.  All numbers were elevated.  He says he was quite uncomfortable with the monitor in place. BPs at home when he checks range sysotlics 892J-194R, diastolics 74Y-81K.  No lightheadedness, no SOB, no CP with exertion. Hoping to get back to walking 5 mi daily, since wife's death has not been walking as regularly.  Takes ASA 81mg  daily for primary prevention, asks if he can stop it. Causing bruising in hands.   Relevant past medical, surgical, family and social history reviewed. Allergies and medications reviewed and updated. Social History   Tobacco Use  Smoking Status Former Smoker  . Packs/day: 0.50  . Years: 10.00  . Pack years: 5.00  . Types: Cigarettes  . Last attempt to quit: 12/11/1978  . Years since quitting: 39.0  Smokeless Tobacco Never Used   ROS: Per HPI   Objective:    BP (!) 146/77   Pulse 62   Temp 98.9 F (37.2 C) (Oral)   Ht 5\' 10"  (1.778 m)   Wt 194 lb (88 kg)   BMI 27.84 kg/m   Wt Readings from Last 3 Encounters:  12/02/17 194 lb (88 kg)  11/26/17 198 lb (89.8 kg)  01/28/17 195 lb (88.5 kg)    Gen: NAD, alert, cooperative with exam, NCAT EYES: EOMI, no conjunctival injection, or no icterus ENT: OP without erythema LYMPH: no cervical LAD CV: NRRR, normal S1/S2, no murmur, distal pulses 2+ b/l Resp: CTABL, no wheezes, normal WOB Ext: No edema, warm Neuro: Alert and oriented, strength equal b/l UE and LE, coordination grossly normal MSK: normal muscle bulk  Assessment & Plan:  Donovyn was seen today for hypertension.  Diagnoses and all orders for this visit:  Essential hypertension Start below. Cont to check  BPs, let me know if regularly elevated. -     amLODipine (NORVASC) 2.5 MG tablet; Take 1 tablet (2.5 mg total) by mouth daily.  Hyperlipidemia, unspecified hyperlipidemia type Stable, cont statin  Follow up plan: Return in about 3 months (around 03/04/2018). [name], MD [place]

## 2017-12-03 DIAGNOSIS — Z23 Encounter for immunization: Secondary | ICD-10-CM | POA: Diagnosis not present

## 2017-12-14 ENCOUNTER — Encounter: Payer: Self-pay | Admitting: Pediatrics

## 2017-12-24 ENCOUNTER — Other Ambulatory Visit: Payer: Self-pay | Admitting: Pediatrics

## 2017-12-24 ENCOUNTER — Encounter: Payer: Self-pay | Admitting: Pediatrics

## 2017-12-24 DIAGNOSIS — I10 Essential (primary) hypertension: Secondary | ICD-10-CM

## 2017-12-24 MED ORDER — AMLODIPINE BESYLATE 5 MG PO TABS
5.0000 mg | ORAL_TABLET | Freq: Every day | ORAL | 4 refills | Status: DC
Start: 2017-12-24 — End: 2018-02-25

## 2018-02-23 DIAGNOSIS — H04123 Dry eye syndrome of bilateral lacrimal glands: Secondary | ICD-10-CM | POA: Diagnosis not present

## 2018-02-23 DIAGNOSIS — Z961 Presence of intraocular lens: Secondary | ICD-10-CM | POA: Diagnosis not present

## 2018-02-23 DIAGNOSIS — H26492 Other secondary cataract, left eye: Secondary | ICD-10-CM | POA: Diagnosis not present

## 2018-02-23 DIAGNOSIS — D3131 Benign neoplasm of right choroid: Secondary | ICD-10-CM | POA: Diagnosis not present

## 2018-02-25 ENCOUNTER — Other Ambulatory Visit: Payer: Self-pay | Admitting: Pediatrics

## 2018-02-25 DIAGNOSIS — L570 Actinic keratosis: Secondary | ICD-10-CM | POA: Diagnosis not present

## 2018-02-25 DIAGNOSIS — Z85820 Personal history of malignant melanoma of skin: Secondary | ICD-10-CM | POA: Diagnosis not present

## 2018-02-25 DIAGNOSIS — Z85828 Personal history of other malignant neoplasm of skin: Secondary | ICD-10-CM | POA: Diagnosis not present

## 2018-02-25 DIAGNOSIS — I10 Essential (primary) hypertension: Secondary | ICD-10-CM

## 2018-03-31 ENCOUNTER — Other Ambulatory Visit: Payer: Self-pay | Admitting: Pediatrics

## 2018-03-31 DIAGNOSIS — E785 Hyperlipidemia, unspecified: Secondary | ICD-10-CM

## 2018-04-10 ENCOUNTER — Other Ambulatory Visit: Payer: Self-pay | Admitting: Pediatrics

## 2018-04-10 DIAGNOSIS — E785 Hyperlipidemia, unspecified: Secondary | ICD-10-CM

## 2018-05-03 DIAGNOSIS — D225 Melanocytic nevi of trunk: Secondary | ICD-10-CM | POA: Diagnosis not present

## 2018-05-03 DIAGNOSIS — L821 Other seborrheic keratosis: Secondary | ICD-10-CM | POA: Diagnosis not present

## 2018-05-03 DIAGNOSIS — D235 Other benign neoplasm of skin of trunk: Secondary | ICD-10-CM | POA: Insufficient documentation

## 2018-05-03 DIAGNOSIS — D229 Melanocytic nevi, unspecified: Secondary | ICD-10-CM | POA: Diagnosis not present

## 2018-05-03 DIAGNOSIS — Z86006 Personal history of melanoma in-situ: Secondary | ICD-10-CM | POA: Diagnosis not present

## 2018-05-03 DIAGNOSIS — L570 Actinic keratosis: Secondary | ICD-10-CM | POA: Diagnosis not present

## 2018-05-03 DIAGNOSIS — L814 Other melanin hyperpigmentation: Secondary | ICD-10-CM | POA: Diagnosis not present

## 2018-05-03 DIAGNOSIS — Z85828 Personal history of other malignant neoplasm of skin: Secondary | ICD-10-CM | POA: Diagnosis not present

## 2018-05-25 ENCOUNTER — Ambulatory Visit (INDEPENDENT_AMBULATORY_CARE_PROVIDER_SITE_OTHER): Payer: Medicare Other | Admitting: Family Medicine

## 2018-05-25 ENCOUNTER — Encounter: Payer: Self-pay | Admitting: Family Medicine

## 2018-05-25 VITALS — BP 126/64 | HR 59 | Temp 98.1°F | Ht 70.0 in | Wt 195.0 lb

## 2018-05-25 DIAGNOSIS — E782 Mixed hyperlipidemia: Secondary | ICD-10-CM | POA: Diagnosis not present

## 2018-05-25 DIAGNOSIS — R9389 Abnormal findings on diagnostic imaging of other specified body structures: Secondary | ICD-10-CM

## 2018-05-25 DIAGNOSIS — I10 Essential (primary) hypertension: Secondary | ICD-10-CM

## 2018-05-25 MED ORDER — ATORVASTATIN CALCIUM 40 MG PO TABS: 40.0000 mg | ORAL_TABLET | Freq: Every day | ORAL | 3 refills | Status: DC

## 2018-05-25 MED ORDER — AMLODIPINE BESYLATE 5 MG PO TABS: 5.0000 mg | ORAL_TABLET | Freq: Every day | ORAL | 3 refills | Status: DC

## 2018-05-25 NOTE — Patient Instructions (Signed)
We are ordering an ultrasound of your carotid arteries to further evaluate some calcifications noted on plain films with your dentist, Dr. Berdine Addison.  We discussed that most people have some degree of deposits within the carotid arteries but if it exceeds the upper limit of normal, we will plan on referring you to vascular surgery for further discussion of removal of these plaques.  Below is some information with regards to carotid artery disease.  Carotid Artery Disease The carotid arteries are the two main arteries on either side of the neck. They supply blood to the brain, face, and neck. Carotid artery disease, also called carotid artery stenosis, is the narrowing or blockage of one or both carotid arteries. Carotid artery disease increases your risk for a stroke or a transient ischemic attack (TIA). A TIA is an episode in which blood flow to the brain is temporarily blocked. A TIA is considered a "warning stroke." What are the causes? This condition is primarily caused by buildup of plaque inside the carotid arteries (atherosclerosis). What increases the risk? The following factors may make you more likely to develop this condition:  High cholesterol (dyslipidemia).  High blood pressure (hypertension).  Smoking.  Obesity.  Diabetes.  Family history of cardiovascular disease.  Inactivity or lack of regular exercise.  Being male. Men have an increased risk of developing atherosclerosis earlier in life than women.  Old age. What are the signs or symptoms? This condition may not have any signs or symptoms until a stroke or TIA occurs. In some cases, your doctor may be able to hear a whooshing sound (bruit) which can indicate a change in blood flow due to plaque buildup. An eye exam can also help identify signs of the condition. How is this diagnosed? This condition may be diagnosed by:  A physical exam.  Your family and medical history.  Specific tests that look at the blood flow in  the carotid arteries. These tests include: ? Carotid artery ultrasound. This test uses sound waves to create pictures of your arteries and show whether they are narrow or blocked. ? Carotid or cerebral angiography. During this test, a special dye will be injected into a vein. The dye will show up on an X-ray to help highlight your arteries. ? Computerized tomographic angiography (CTA). During this test, a special dye will be injected into a vein. The dye will show up on the CT scan to help highlight your arteries. ? Magnetic resonance angiography (MRA). During this test, a special dye will be injected into a vein. The dye will show up on the MRI to help highlight your arteries. How is this treated? Treatment of this condition may include a combination of treatments. Treatment options include:  Lifestyle changes such as: ? Quitting smoking. ? Exercising regularly or as directed by your health care provider. ? Eating a heart-healthy diet. ? Managing stress. ? Maintaining a healthy weight.  Medicines to control: ? Blood pressure. ? Cholesterol. ? Blood clotting.  Surgery. You may have: ? A carotid endarterectomy. This is a surgery to remove the blockages in the carotid arteries. ? A carotid angioplasty with stenting. This is a procedure in which a wire mesh (stent) is used to widen the blocked carotid arteries. Follow these instructions at home: Lifestyle  Follow your health care provider's instructions about your diet. It is important to eat a healthy diet that is low in saturated fats and includes plenty of fresh fruits, vegetables, and lean meats. You should avoid high-fat, high-sodium foods  as well as foods that are fried, overly processed, or have poor nutritional value.  Maintain a healthy weight.  Stay physically active. It is recommended that you get at least 150 minutes of moderate exercise or 75 minutes of strenuous exercise each week.  Do not use any products that contain  nicotine or tobacco, such as cigarettes and e-cigarettes. If you need help quitting, ask your health care provider.  Limit alcohol intake to no more than 1 drink a day for non-pregnant women and 2 drinks a day for men. One drink equals 12 oz. of beer, 5 oz. of wine, or 1 oz. of hard liquor.  Do not use illegal drugs.  Manage your stress. Ask your health care provider for stress management tips. General instructions  Take over-the-counter and prescription medicines only as told by your health care provider.  Keep all follow-up visits as told by your health care provider. This is important. Get help right away if:  You have any symptoms of stroke or TIA. The acronym BEFAST is an easy way to remember the main warning signs of stroke. ? B = Balance problems. Signs include dizziness, sudden trouble walking, or loss of balance ? E = Eye problems. This includes trouble seeing or a sudden change in vision. ? F = Face changes. This includes sudden weakness or numbness of the face, or the face or eyelid drooping to one side. ? A = Arm weakness or numbness. This happens suddenly and usually on one side of the body. ? S = Speech problems. This includes trouble speaking or trouble understanding speech. ? T = Time. Time to call 911 or seek emergency care. Do not wait to see if symptoms go away. Make note of the time your symptoms started.  Other signs of stroke may include: ? A sudden, severe headache with no known cause. ? Nausea or vomiting. ? Seizure. These symptoms may represent a serious problem that is an emergency. Do not wait to see if the symptoms will go away. Get medical help right away. Call your local emergency services (911 in the [place] Do not drive yourself to the hospital. Summary  Carotid artery disease, also called carotid artery stenosis, is the narrowing or blockage of one or both carotid arteries.  Carotid artery disease increases your risk for a stroke or a transient ischemic  attack (TIA).  This condition can be treated with lifestyle changes, medicines, surgery, or a combination of these treatments.  Do not use any products that contain nicotine or tobacco, such as cigarettes and e-cigarettes. If you need help quitting, ask your health care provider. This information is not intended to replace advice given to you by your health care provider. Make sure you discuss any questions you have with your health care provider. Document Released: 06/30/2011 Document Revised: 05/12/2016 Document Reviewed: 05/12/2016 Elsevier Interactive Patient Education  2019 [employer].

## 2018-05-25 NOTE — Progress Notes (Signed)
Subjective: CC: abnormal xray PCP: Janora Norlander, DO [id number] David Guerrero is a 78 y.o. male presenting to clinic today for:  1. Abnormal xray Patient reports that he was seen by his dentist in June of last year.  At that time, they had a normal dental x-ray which demonstrated some radiopacities in the right side of the neck.  There was concern for carotid artery calcification and he is here today to discuss ultrasound of the neck/carotid arteries.  He has a history of both hypertension and hyperlipidemia and he is on medications for this with Norvasc 5 mg and Lipitor 40 mg.  He monitors blood pressures regularly at home and they typically run him 272Z to 366Y systolic.  He denies any chest pain, shortness of breath, headaches or dizziness.  He has lost about 30 pounds since retirement through diet modification and regular exercise.  He avoids red meat pretty regularly and has not had a hamburger in over 6 months.  He predominately relies on chicken, fish and plant-based diet.  He is a former smoker that quit in the 80s.  ROS: Per HPI  No Known Allergies Past Medical History:  Diagnosis Date  . Cancer ([place])    melanoma, removed apprx 2013, early stage per pt  . Cataract 12/14/2008  . Hyperlipidemia     Current Outpatient Medications:  .  amLODipine (NORVASC) 5 MG tablet, TAKE 1 TABLET BY MOUTH EVERY DAY, Disp: 90 tablet, Rfl: 0 .  atorvastatin (LIPITOR) 40 MG tablet, TAKE 1 TABLET (40 MG TOTAL) BY MOUTH DAILY AT 6 PM., Disp: 90 tablet, Rfl: 0 Social History   Socioeconomic History  . Marital status: Widowed    Spouse name: Not on file  . Number of children: 1  . Years of education: Not on file  . Highest education level: Bachelor's degree (e.g., BA, AB, BS)  Occupational History  . Occupation: Retired  . Occupation: Self Employed    Comment: [phone]  Social Needs  . Financial resource strain: Not hard at all  . Food insecurity:    Worry: Never true    Inability: Never  true  . Transportation needs:    Medical: No    Non-medical: No  Tobacco Use  . Smoking status: Former Smoker    Packs/day: 0.50    Years: 10.00    Pack years: 5.00    Types: Cigarettes    Last attempt to quit: 12/11/1978    Years since quitting: 39.4  . Smokeless tobacco: Never Used  Substance and Sexual Activity  . Alcohol use: Yes    Alcohol/week: 2.0 - 3.0 standard drinks    Types: 2 - 3 Glasses of wine per week  . Drug use: No  . Sexual activity: Not Currently  Lifestyle  . Physical activity:    Days per week: 5 days    Minutes per session: 60 min  . Stress: Not at all  Relationships  . Social connections:    Talks on phone: More than three times a week    Gets together: More than three times a week    Attends religious service: More than 4 times per year    Active member of club or organization: Yes    Attends meetings of clubs or organizations: 1 to 4 times per year    Relationship status: Widowed  . Intimate partner violence:    Fear of current or ex partner: No    Emotionally abused: No    Physically abused: No  Forced sexual activity: No  Other Topics Concern  . Not on file  Social History Narrative  . Not on file   Family History  Problem Relation Age of Onset  . Arthritis Mother   . Cancer Father     Objective: Office vital signs reviewed. BP 126/64   Pulse (!) 59   Temp 98.1 F (36.7 David) (Oral)   Ht 5\' 10"  (1.778 m)   Wt 195 lb (88.5 kg)   BMI 27.98 kg/m   Physical Examination:  General: Awake, alert, well nourished, No acute distress HEENT: Normal    Neck: No masses palpated. No JVD. No Carotid bruits. Cardio: regular rate and rhythm, S1S2 heard, no murmurs appreciated Pulm: clear to auscultation bilaterally, no wheezes, rhonchi or rales; normal work of breathing on room air Extremities: warm, well perfused, No edema, cyanosis or clubbing; +2 pulses bilaterally  Assessment/ Plan: 78 y.o. male   1. Mixed hyperlipidemia I have  refilled his Lipitor.  I reviewed his last lipid panel.  Due for fasting labs in August. - VAS US CAROTID; Future - atorvastatin (LIPITOR) 40 MG tablet; Take 1 tablet (40 mg total) by mouth daily at 6 PM.  Dispense: 90 tablet; Refill: 3  2. Essential hypertension Blood pressure under excellent control with Norvasc 5 mg daily.  Refills of Norvasc sent - VAS US CAROTID; Future - amLODipine (NORVASC) 5 MG tablet; Take 1 tablet (5 mg total) by mouth daily.  Dispense: 90 tablet; Refill: 3  3. Abnormal x-ray of neck We have ordered a vascular ultrasound of the carotid arteries to further evaluate concerning calcifications on plain films.  We discussed the nature of carotid artery disease and threshold for referral to vascular surgery.  Handout was provided with more information.  We will contact the patient once the results of this imaging study are available and provide appropriate recommendations at that time. - VAS US CAROTID; Future   No orders of the defined types were placed in this encounter.  Meds ordered this encounter  Medications  . amLODipine (NORVASC) 5 MG tablet    Sig: Take 1 tablet (5 mg total) by mouth daily.    Dispense:  90 tablet    Refill:  3  . atorvastatin (LIPITOR) 40 MG tablet    Sig: Take 1 tablet (40 mg total) by mouth daily at 6 PM.    Dispense:  90 tablet    Refill:  Foley, DO [place] [phone]

## 2018-05-26 ENCOUNTER — Ambulatory Visit (HOSPITAL_COMMUNITY)
Admission: RE | Admit: 2018-05-26 | Discharge: 2018-05-26 | Disposition: A | Discharge status: Home / Self Care | Payer: Medicare Other | Source: Ambulatory Visit | Attending: Family Medicine | Admitting: Family Medicine

## 2018-05-26 DIAGNOSIS — R9389 Abnormal findings on diagnostic imaging of other specified body structures: Secondary | ICD-10-CM | POA: Insufficient documentation

## 2018-05-26 DIAGNOSIS — E782 Mixed hyperlipidemia: Secondary | ICD-10-CM | POA: Diagnosis not present

## 2018-05-26 DIAGNOSIS — I10 Essential (primary) hypertension: Secondary | ICD-10-CM | POA: Diagnosis not present

## 2018-05-26 NOTE — Progress Notes (Signed)
Carotid duplex       has been completed. Preliminary results can be found under CV proc through chart review. Satoru Milich, BS, RDMS, RVT   

## 2018-06-14 ENCOUNTER — Ambulatory Visit: Payer: Medicare Other | Admitting: Family Medicine

## 2018-07-09 DIAGNOSIS — H10013 Acute follicular conjunctivitis, bilateral: Secondary | ICD-10-CM | POA: Diagnosis not present

## 2018-12-09 DIAGNOSIS — Z23 Encounter for immunization: Secondary | ICD-10-CM | POA: Diagnosis not present

## 2018-12-10 ENCOUNTER — Other Ambulatory Visit: Payer: Self-pay

## 2018-12-10 ENCOUNTER — Encounter: Payer: Self-pay | Admitting: Family Medicine

## 2018-12-10 ENCOUNTER — Ambulatory Visit (INDEPENDENT_AMBULATORY_CARE_PROVIDER_SITE_OTHER): Payer: Medicare Other | Admitting: Family Medicine

## 2018-12-10 VITALS — BP 167/88 | HR 85 | Temp 97.1°F | Ht 70.0 in | Wt 191.6 lb

## 2018-12-10 DIAGNOSIS — K1379 Other lesions of oral mucosa: Secondary | ICD-10-CM | POA: Diagnosis not present

## 2018-12-10 DIAGNOSIS — I10 Essential (primary) hypertension: Secondary | ICD-10-CM | POA: Diagnosis not present

## 2018-12-10 NOTE — Patient Instructions (Signed)
Follow up in 2 weeks for BP check with the nurse.  Make sure to take your Amlodipine before that visit.

## 2018-12-10 NOTE — Progress Notes (Signed)
Subjective: CC: Oral bleeding PCP: Janora Norlander, DO [id number] C David Guerrero is a 78 y.o. male presenting to clinic today for:  1. Oral bleeding Patient reports that in the middle of the night he felt that his mouth was dry.  He drank some water and he felt like something was in the back of his mouth that needed to be spit out.  He uses polydent for the dental apparatus.  He started having bleeding.  He notes bleeding was very short lived.  No recurrence.  He has not visualized any masses/ lesions.  No difficulty swallowing.  Not sure if he snores, as he resides alone.  He has not smoked since 1982.  2.  Hypertension Patient reports compliance with Norvasc 5 mg daily.  No chest pain or shortness of breath.  He is a physically active male who walks several miles per day.  He maintains a plant-based diet but does eat some lean meats including fish.  He wears a Fitbit which monitors his vitals.  ROS: Per HPI  No Known Allergies Past Medical History:  Diagnosis Date  . Cancer ([place])    melanoma, removed apprx 2013, early stage per pt  . Cataract 12/14/2008  . Hyperlipidemia     Current Outpatient Medications:  .  amLODipine (NORVASC) 5 MG tablet, Take 1 tablet (5 mg total) by mouth daily., Disp: 90 tablet, Rfl: 3 .  atorvastatin (LIPITOR) 40 MG tablet, Take 1 tablet (40 mg total) by mouth daily at 6 PM., Disp: 90 tablet, Rfl: 3 Social History   Socioeconomic History  . Marital status: Widowed    Spouse name: Not on file  . Number of children: 1  . Years of education: Not on file  . Highest education level: Bachelor's degree (e.g., BA, AB, BS)  Occupational History  . Occupation: Retired  . Occupation: Self Employed    Comment: [phone]  Social Needs  . Financial resource strain: Not hard at all  . Food insecurity    Worry: Never true    Inability: Never true  . Transportation needs    Medical: No    Non-medical: No  Tobacco Use  . Smoking status: Former Smoker   Packs/day: 0.50    Years: 10.00    Pack years: 5.00    Types: Cigarettes    Quit date: 12/11/1978    Years since quitting: 40.0  . Smokeless tobacco: Never Used  Substance and Sexual Activity  . Alcohol use: Yes    Alcohol/week: 2.0 - 3.0 standard drinks    Types: 2 - 3 Glasses of wine per week  . Drug use: No  . Sexual activity: Not Currently  Lifestyle  . Physical activity    Days per week: 5 days    Minutes per session: 60 min  . Stress: Not at all  Relationships  . Social connections    Talks on phone: More than three times a week    Gets together: More than three times a week    Attends religious service: More than 4 times per year    Active member of club or organization: Yes    Attends meetings of clubs or organizations: 1 to 4 times per year    Relationship status: Widowed  . Intimate partner violence    Fear of current or ex partner: No    Emotionally abused: No    Physically abused: No    Forced sexual activity: No  Other Topics Concern  . Not on file  Social History Narrative  . Not on file   Family History  Problem Relation Age of Onset  . Arthritis Mother   . Cancer Father     Objective: Office vital signs reviewed. BP (!) 167/88   Pulse 85   Temp (!) 97.1 F (36.2 C) (Temporal)   Ht 5\' 10"  (1.778 m)   Wt 191 lb 9.6 oz (86.9 kg)   BMI 27.49 kg/m   Physical Examination:  General: Awake, alert, well nourished, No acute distress HEENT: Normal    Throat: moist mucus membranes, denture in place. no erythema, he has 2 small petechiae on either side of the uvula. No gross mucosal edema. No exudates. No active bleeding.  Cardio: regular rate  Pulm: normal work of breathing on room air  Assessment/ Plan: 78 y.o. male   1. Oral bleeding Resolved.  No active bleeding noted in the throat but there are 2 small petechiae on either side of the uvula noted.  I suspect this is related to barotrauma related to coughing.  We discussed monitoring signs and  symptoms and if he has ongoing signs and symptoms suggestive of sleep apnea we should consider sleep study versus evaluation by ENT.  2. Accelerated hypertension Possibly related to anxiety surrounding visit.  Have asked that he return in 2 weeks for blood pressure recheck with the nurse.  If persistently elevated we discussed that we would increase his amlodipine to 10 mg daily.  He voiced good understanding of plan.  Continue to maintain plant-based diet with at least 30 minutes of moderate physical activity daily.   No orders of the defined types were placed in this encounter.  No orders of the defined types were placed in this encounter.    Janora Norlander, DO [place] [phone]

## 2018-12-14 ENCOUNTER — Telehealth: Payer: Self-pay | Admitting: Family Medicine

## 2018-12-14 NOTE — Telephone Encounter (Signed)
appt 12/20/18

## 2018-12-17 ENCOUNTER — Other Ambulatory Visit: Payer: Self-pay

## 2018-12-20 ENCOUNTER — Encounter: Payer: Self-pay | Admitting: Family Medicine

## 2018-12-20 ENCOUNTER — Ambulatory Visit (INDEPENDENT_AMBULATORY_CARE_PROVIDER_SITE_OTHER): Payer: Medicare Other | Admitting: Family Medicine

## 2018-12-20 VITALS — BP 116/62 | HR 68

## 2018-12-20 DIAGNOSIS — K1379 Other lesions of oral mucosa: Secondary | ICD-10-CM | POA: Diagnosis not present

## 2018-12-20 DIAGNOSIS — I10 Essential (primary) hypertension: Secondary | ICD-10-CM | POA: Diagnosis not present

## 2018-12-20 NOTE — Progress Notes (Signed)
Telephone visit  Subjective: CC:f/u HTN, oral pain PCP: Janora Norlander, DO [id number] C Pell is a 78 y.o. male calls for telephone consult today. Patient provides verbal consent for consult held via phone.  Location of patient: home Location of provider: Working remotely from home Others present for call: none  1. Hypertension Patient reports blood pressure has fluctuated a bit since our last visit.  His highest blood pressure and only elevated blood pressure was 154/84.  This occurred at 4:30 PM on the 24th of the month.  He since has had blood pressures ranging anywhere from 1 Q000111Q 48 systolics and 0000000 to Q000111Q diastolic.  He reports compliance with Norvasc 5 mg daily.  He walks 3 miles every morning.  No chest pain, shortness of breath.  He is tolerating exercise without difficulty.  2.  Oral bleed/pain Patient reports total resolution of oral symptoms but still wanted to have somebody look in the back of his throat to double check that things have healed appropriately back there.  He is eating normally and without difficulty.   ROS: Per HPI  No Known Allergies Past Medical History:  Diagnosis Date  . Cancer ([place])    melanoma, removed apprx 2013, early stage per pt  . Cataract 12/14/2008  . Hyperlipidemia     Current Outpatient Medications:  .  amLODipine (NORVASC) 5 MG tablet, Take 1 tablet (5 mg total) by mouth daily., Disp: 90 tablet, Rfl: 3 .  atorvastatin (LIPITOR) 40 MG tablet, Take 1 tablet (40 mg total) by mouth daily at 6 PM., Disp: 90 tablet, Rfl: 3  Blood pressure 116/62, pulse 68.  Assessment/ Plan: 78 y.o. male   1. Essential hypertension Stable.  Blood pressure is within normal range.  He will come in for a spot blood pressure check with the RN to make sure that his monitor is calibrated 2 hours appropriately.  For now, continue Norvasc 5 mg daily.  2. Oral bleeding Resolved.  We will plan to spot check his throat during his nurse visit.   Start time:  8:20am End time: 9:34am  Total time spent on patient care (including telephone call/ virtual visit): 18 minutes  [place], [place] [phone]

## 2018-12-28 ENCOUNTER — Other Ambulatory Visit: Payer: Self-pay

## 2018-12-28 ENCOUNTER — Ambulatory Visit: Payer: Medicare Other | Admitting: *Deleted

## 2018-12-28 VITALS — BP 132/70 | HR 67

## 2018-12-28 DIAGNOSIS — Z0130 Encounter for examination of blood pressure without abnormal findings: Secondary | ICD-10-CM

## 2018-12-28 NOTE — Progress Notes (Signed)
BP appropriate.

## 2018-12-28 NOTE — Progress Notes (Signed)
Pt here for BP check BP 132 70 P 67

## 2019-01-26 DIAGNOSIS — D225 Melanocytic nevi of trunk: Secondary | ICD-10-CM | POA: Diagnosis not present

## 2019-01-26 DIAGNOSIS — Z85828 Personal history of other malignant neoplasm of skin: Secondary | ICD-10-CM | POA: Diagnosis not present

## 2019-01-26 DIAGNOSIS — L820 Inflamed seborrheic keratosis: Secondary | ICD-10-CM | POA: Insufficient documentation

## 2019-01-26 DIAGNOSIS — Z85820 Personal history of malignant melanoma of skin: Secondary | ICD-10-CM | POA: Diagnosis not present

## 2019-01-26 DIAGNOSIS — L814 Other melanin hyperpigmentation: Secondary | ICD-10-CM | POA: Diagnosis not present

## 2019-02-21 DIAGNOSIS — Z961 Presence of intraocular lens: Secondary | ICD-10-CM | POA: Diagnosis not present

## 2019-02-21 DIAGNOSIS — H26492 Other secondary cataract, left eye: Secondary | ICD-10-CM | POA: Diagnosis not present

## 2019-04-25 ENCOUNTER — Telehealth: Payer: Self-pay | Admitting: Family Medicine

## 2019-04-25 NOTE — Telephone Encounter (Signed)
Pt called back requesting to speak with Dr Lajuana Ripple

## 2019-04-25 NOTE — Telephone Encounter (Signed)
Pt had questions regarding when the COVID vaccine may be available. Gave pt the number for direct line regarding COVID vaccine questions [phone] menu option 1.

## 2019-04-25 NOTE — Telephone Encounter (Signed)
lmtcb

## 2019-04-27 ENCOUNTER — Encounter: Payer: Self-pay | Admitting: Family Medicine

## 2019-06-05 ENCOUNTER — Encounter: Payer: Self-pay | Admitting: Family Medicine

## 2019-06-06 ENCOUNTER — Encounter: Payer: Self-pay | Admitting: Family Medicine

## 2019-06-27 ENCOUNTER — Other Ambulatory Visit: Payer: Self-pay | Admitting: Family Medicine

## 2019-06-27 DIAGNOSIS — E782 Mixed hyperlipidemia: Secondary | ICD-10-CM

## 2019-06-27 DIAGNOSIS — I10 Essential (primary) hypertension: Secondary | ICD-10-CM

## 2019-07-06 ENCOUNTER — Encounter: Payer: Self-pay | Admitting: Family Medicine

## 2019-07-06 ENCOUNTER — Telehealth (INDEPENDENT_AMBULATORY_CARE_PROVIDER_SITE_OTHER): Payer: Medicare Other | Admitting: Family Medicine

## 2019-07-06 DIAGNOSIS — J301 Allergic rhinitis due to pollen: Secondary | ICD-10-CM

## 2019-07-06 MED ORDER — LORATADINE 10 MG PO TABS: 10.0000 mg | ORAL_TABLET | Freq: Every day | ORAL | 11 refills | Status: DC

## 2019-07-06 MED ORDER — FLUTICASONE PROPIONATE 50 MCG/ACT NA SUSP: 2.0000 | Freq: Every day | NASAL | 6 refills | Status: DC

## 2019-07-06 NOTE — Progress Notes (Signed)
Virtual Visit via MyChart Video Note Due to COVID-19 pandemic this visit was conducted virtually. This visit type was conducted due to national recommendations for restrictions regarding the COVID-19 Pandemic (e.g. social distancing, sheltering in place) in an effort to limit this patient's exposure and mitigate transmission in our community. All issues noted in this document were discussed and addressed.  A physical exam was not performed with this format.   I connected with [name] on 07/06/2019 at 1500 by MyChart Video and verified that I am speaking with the correct person using two identifiers. David Guerrero is currently located at home and no one is currently with them during visit. The provider, Monia Pouch, FNP is located in their office at time of visit.  I discussed the limitations, risks, security and privacy concerns of performing an evaluation and management service by video / telephone and the availability of in person appointments. I also discussed with the patient that there may be a patient responsible charge related to this service. The patient expressed understanding and agreed to proceed.  Subjective:  Patient ID: David Guerrero, male    DOB: Jun 10, 1940, 79 y.o.   MRN: [id number]  Chief Complaint:  Sinus Problem   HPI: David Guerrero is a 79 y.o. male presenting on 07/06/2019 for Sinus Problem   Sinus Problem This is a recurrent problem. The current episode started 1 to 4 weeks ago. The problem has been waxing and waning since onset. There has been no fever. His pain is at a severity of 0/10. He is experiencing no pain. Associated symptoms include congestion, coughing, sneezing and a sore throat. Pertinent negatives include no chills, diaphoresis, ear pain, headaches, hoarse voice, neck pain, shortness of breath, sinus pressure or swollen glands. Past treatments include nothing. The treatment provided no relief.     Relevant past medical, surgical, family, and social  history reviewed and updated as indicated.  Allergies and medications reviewed and updated.   Past Medical History:  Diagnosis Date  . Cancer ([place])    melanoma, removed apprx 2013, early stage per pt  . Cataract 12/14/2008  . Hyperlipidemia     Past Surgical History:  Procedure Laterality Date  . EYE SURGERY     Cataracts  . MELANOMA EXCISION     L upper arm    Social History   Socioeconomic History  . Marital status: Widowed    Spouse name: Not on file  . Number of children: 1  . Years of education: Not on file  . Highest education level: Bachelor's degree (e.g., BA, AB, BS)  Occupational History  . Occupation: Retired  . Occupation: Self Employed    Comment: [name]  Tobacco Use  . Smoking status: Former Smoker    Packs/day: 0.50    Years: 10.00    Pack years: 5.00    Types: Cigarettes    Quit date: 12/11/1978    Years since quitting: 40.5  . Smokeless tobacco: Never Used  Substance and Sexual Activity  . Alcohol use: Yes    Alcohol/week: 2.0 - 3.0 standard drinks    Types: 2 - 3 Glasses of wine per week  . Drug use: No  . Sexual activity: Not Currently  Other Topics Concern  . Not on file  Social History Narrative  . Not on file   Social Determinants of Health   Financial Resource Strain:   . Difficulty of Paying Living Expenses:   Food Insecurity:   . Worried About Running  Out of Food in the Last Year:   . [place] in the Last Year:   Transportation Needs:   . Lack of Transportation (Medical):   Marland Kitchen Lack of Transportation (Non-Medical):   Physical Activity:   . Days of Exercise per Week:   . Minutes of Exercise per Session:   Stress:   . Feeling of Stress :   Social Connections:   . Frequency of Communication with Friends and Family:   . Frequency of Social Gatherings with Friends and Family:   . Attends Religious Services:   . Active Member of Clubs or Organizations:   . Attends Archivist Meetings:   Marland Kitchen Marital Status:     Intimate Partner Violence:   . Fear of Current or Ex-Partner:   . Emotionally Abused:   Marland Kitchen Physically Abused:   . Sexually Abused:     Outpatient Encounter Medications as of 07/06/2019  Medication Sig  . amLODipine (NORVASC) 5 MG tablet TAKE 1 TABLET BY MOUTH EVERY DAY  . atorvastatin (LIPITOR) 40 MG tablet TAKE 1 TABLET (40 MG TOTAL) BY MOUTH DAILY AT 6 PM.  . fluticasone (FLONASE) 50 MCG/ACT nasal spray Place 2 sprays into both nostrils daily.  Marland Kitchen loratadine (CLARITIN) 10 MG tablet Take 1 tablet (10 mg total) by mouth daily.   No facility-administered encounter medications on file as of 07/06/2019.    No Known Allergies  Review of Systems  Constitutional: Negative for activity change, appetite change, chills, diaphoresis, fatigue, fever and unexpected weight change.  HENT: Positive for congestion, rhinorrhea, sneezing and sore throat. Negative for dental problem, drooling, ear discharge, ear pain, facial swelling, hearing loss, hoarse voice, mouth sores, nosebleeds, postnasal drip, sinus pressure, sinus pain, tinnitus, trouble swallowing and voice change.   Eyes: Positive for itching. Negative for photophobia, pain, discharge, redness and visual disturbance.  Respiratory: Positive for cough. Negative for chest tightness, shortness of breath and wheezing.   Cardiovascular: Negative for chest pain, palpitations and leg swelling.  Gastrointestinal: Negative for abdominal pain, blood in stool, constipation, diarrhea, nausea and vomiting.  Endocrine: Negative.   Genitourinary: Negative for decreased urine volume, difficulty urinating, dysuria, frequency and urgency.  Musculoskeletal: Negative for arthralgias, myalgias and neck pain.  Skin: Negative.   Allergic/Immunologic: Negative.   Neurological: Negative for dizziness, tremors, seizures, syncope, facial asymmetry, speech difficulty, weakness, light-headedness, numbness and headaches.  Hematological: Negative.   Psychiatric/Behavioral:  Negative for confusion, hallucinations, sleep disturbance and suicidal ideas.  All other systems reviewed and are negative.        Observations/Objective: No vital signs or physical exam, this was a telephone or virtual health encounter.  Pt alert and oriented, answers all questions appropriately, and able to speak in full sentences.    Assessment and Plan: Elvis was seen today for sinus problem.  Diagnoses and all orders for this visit:  Seasonal allergic rhinitis due to pollen Reported symptoms consistent with allergic rhinitis. Will initiate below. Symptomatic care discussed in detail. Report any new, worsening, or persistent symptoms.  -     fluticasone (FLONASE) 50 MCG/ACT nasal spray; Place 2 sprays into both nostrils daily. -     loratadine (CLARITIN) 10 MG tablet; Take 1 tablet (10 mg total) by mouth daily.     Follow Up Instructions: Return if symptoms worsen or fail to improve.    I discussed the assessment and treatment plan with the patient. The patient was provided an opportunity to ask questions and all were answered. The patient  agreed with the plan and demonstrated an understanding of the instructions.   The patient was advised to call back or seek an in-person evaluation if the symptoms worsen or if the condition fails to improve as anticipated.  The above assessment and management plan was discussed with the patient. The patient verbalized understanding of and has agreed to the management plan. Patient is aware to call the clinic if they develop any new symptoms or if symptoms persist or worsen. Patient is aware when to return to the clinic for a follow-up visit. Patient educated on when it is appropriate to go to the emergency department.    I provided 15 minutes of non-face-to-face time during this encounter. The video started at 1500. The call ended at 1515. The other time was used for coordination of care.    Monia Pouch, FNP-C [facility] [address] [place], [place] [address] [phone] 07/06/2019

## 2019-07-20 ENCOUNTER — Encounter: Payer: Self-pay | Admitting: Family Medicine

## 2019-09-27 ENCOUNTER — Other Ambulatory Visit: Payer: Self-pay | Admitting: Family Medicine

## 2019-09-27 DIAGNOSIS — I10 Essential (primary) hypertension: Secondary | ICD-10-CM

## 2019-10-21 ENCOUNTER — Other Ambulatory Visit: Payer: Self-pay | Admitting: Family Medicine

## 2019-10-21 DIAGNOSIS — I10 Essential (primary) hypertension: Secondary | ICD-10-CM

## 2019-10-21 MED ORDER — AMLODIPINE BESYLATE 5 MG PO TABS: 5.0000 mg | ORAL_TABLET | Freq: Every day | ORAL | 0 refills | Status: DC

## 2019-10-21 NOTE — Addendum Note (Signed)
Addended by: Ladean Raya on: 10/21/2019 03:08 PM   Modules accepted: Orders

## 2019-10-21 NOTE — Telephone Encounter (Signed)
Refill sent in. Patient aware and verbalized understanding.

## 2019-10-21 NOTE — Telephone Encounter (Signed)
Pt scheduled for 11/25/19 @ 1:30pm. Would like to see if enough medication can be sent in to last until that appt.

## 2019-10-23 ENCOUNTER — Other Ambulatory Visit: Payer: Self-pay | Admitting: Family Medicine

## 2019-10-23 DIAGNOSIS — E782 Mixed hyperlipidemia: Secondary | ICD-10-CM

## 2019-11-16 ENCOUNTER — Other Ambulatory Visit: Payer: Self-pay | Admitting: Family Medicine

## 2019-11-16 DIAGNOSIS — I10 Essential (primary) hypertension: Secondary | ICD-10-CM

## 2019-11-23 ENCOUNTER — Other Ambulatory Visit: Payer: Self-pay | Admitting: Family Medicine

## 2019-11-23 DIAGNOSIS — E782 Mixed hyperlipidemia: Secondary | ICD-10-CM

## 2019-11-25 ENCOUNTER — Other Ambulatory Visit: Payer: Self-pay

## 2019-11-25 ENCOUNTER — Encounter: Payer: Self-pay | Admitting: Family Medicine

## 2019-11-25 ENCOUNTER — Ambulatory Visit (INDEPENDENT_AMBULATORY_CARE_PROVIDER_SITE_OTHER): Payer: Medicare Other | Admitting: Family Medicine

## 2019-11-25 VITALS — BP 125/69 | HR 68 | Temp 98.1°F | Ht 70.0 in | Wt 185.4 lb

## 2019-11-25 DIAGNOSIS — R413 Other amnesia: Secondary | ICD-10-CM

## 2019-11-25 DIAGNOSIS — E782 Mixed hyperlipidemia: Secondary | ICD-10-CM | POA: Diagnosis not present

## 2019-11-25 DIAGNOSIS — I10 Essential (primary) hypertension: Secondary | ICD-10-CM

## 2019-11-25 DIAGNOSIS — R4789 Other speech disturbances: Secondary | ICD-10-CM

## 2019-11-25 MED ORDER — AMLODIPINE BESYLATE 5 MG PO TABS: 5.0000 mg | ORAL_TABLET | Freq: Every day | ORAL | 3 refills | Status: DC

## 2019-11-25 MED ORDER — ATORVASTATIN CALCIUM 40 MG PO TABS: 40.0000 mg | ORAL_TABLET | Freq: Every day | ORAL | 3 refills | Status: DC

## 2019-11-25 NOTE — Progress Notes (Signed)
Subjective: CC: Follow-up hypertension with hyperlipidemia PCP: Janora Norlander, DO [facility]:SRPRX David Guerrero is a 80 y.o. male presenting to clinic today for:  1. Hypertension with hyperlipidemia Patient reports compliance with Norvasc 5 mg daily, atorvastatin 40 mg daily. Not reporting chest pain, shortness of breath, headache or falls.  2. Memory loss Patient would like memory testing. His daughter was concerned about memory loss. He often will forget his keys or wallet. His daughter notes that he has had difficulty finding words intermittently which concerned her. Apparently his mother had a stroke and presented similarly. He, sadly, lost his wife this February due to illness. She was prior to her diagnosis extremely healthy and on no medications.   ROS: Per HPI  No Known Allergies Past Medical History:  Diagnosis Date   Cancer ([place])    melanoma, removed apprx 2013, early stage per pt   Cataract 12/14/2008   Hyperlipidemia     Current Outpatient Medications:    amLODipine (NORVASC) 5 MG tablet, Take 1 tablet (5 mg total) by mouth daily., Disp: 30 tablet, Rfl: 0   atorvastatin (LIPITOR) 40 MG tablet, Take 1 tablet (40 mg total) by mouth daily at 6 PM., Disp: 30 tablet, Rfl: 0   fluticasone (FLONASE) 50 MCG/ACT nasal spray, Place 2 sprays into both nostrils daily., Disp: 16 g, Rfl: 6   loratadine (CLARITIN) 10 MG tablet, Take 1 tablet (10 mg total) by mouth daily., Disp: 30 tablet, Rfl: 11 Social History   Socioeconomic History   Marital status: Widowed    Spouse name: Not on file   Number of children: 1   Years of education: Not on file   Highest education level: Bachelor's degree (e.g., BA, AB, BS)  Occupational History   Occupation: Retired   Occupation: Self Employed    Comment: [phone]  Tobacco Use   Smoking status: Former Smoker    Packs/day: 0.50    Years: 10.00    Pack years: 5.00    Types: Cigarettes    Quit date: 12/11/1978    Years since  quitting: 40.9   Smokeless tobacco: Never Used  Vaping Use   Vaping Use: Never used  Substance and Sexual Activity   Alcohol use: Yes    Alcohol/week: 2.0 - 3.0 standard drinks    Types: 2 - 3 Glasses of wine per week   Drug use: No   Sexual activity: Not Currently  Other Topics Concern   Not on file  Social History Narrative   Not on file   Social Determinants of Health   Financial Resource Strain:    Difficulty of Paying Living Expenses:   Food Insecurity:    Worried About Charity fundraiser in the Last Year:    Arboriculturist in the Last Year:   Transportation Needs:    Film/video editor (Medical):    Lack of Transportation (Non-Medical):   Physical Activity:    Days of Exercise per Week:    Minutes of Exercise per Session:   Stress:    Feeling of Stress :   Social Connections:    Frequency of Communication with Friends and Family:    Frequency of Social Gatherings with Friends and Family:    Attends Religious Services:    Active Member of Clubs or Organizations:    Attends Archivist Meetings:    Marital Status:   Intimate Partner Violence:    Fear of Current or Ex-Partner:    Emotionally Abused:  Physically Abused:    Sexually Abused:    Family History  Problem Relation Age of Onset   Arthritis Mother    Cancer Father     Objective: Office vital signs reviewed. BP 125/69    Pulse 68    Temp 98.1 F (36.7 David)    Ht '5\' 10"'  (1.778 m)    Wt 185 lb 6.4 oz (84.1 kg)    SpO2 96%    BMI 26.60 kg/m   Physical Examination:  General: Awake, alert, well nourished, No acute distress HEENT: Normal; sclera white. No carotid bruits Cardio: regular rate and rhythm, S1S2 heard, no murmurs appreciated Pulm: clear to auscultation bilaterally, no wheezes, rhonchi or rales; normal work of breathing on room air Extremities: warm, well perfused, No edema, cyanosis or clubbing; +2 pulses bilaterally MSK: normal gait and  station Skin: dry; intact; no rashes or lesions Neuro: AOx3, no focal neurologic deficits  MMSE - Mini Mental State Exam 11/25/2019 11/26/2017  Orientation to time 4 5  Orientation to Place 5 5  Registration 3 3  Attention/ Calculation 5 5  Recall 0 3  Language- name 2 objects 2 2  Language- repeat 1 1  Language- follow 3 step command 3 3  Language- read & follow direction 1 1  Write a sentence 1 1  Copy design 1 1  Total score 26 30     Assessment/ Plan: 79 y.o. male   1. Memory loss Uncertain etiology. While his MMSE score is still considered normal range, he has had about a four-point drop in less than 2 years. I am going to look for metabolic etiology. MRI also ordered of the brain given reports of word finding difficulties. He has at risk for cerebrovascular disease due to hyperlipidemia and hypertension. However, these have been fairly well controlled in the last few years. I discussed today's findings with his daughter as well. I will contact Anderson Malta at the discretion of her father as needed. - CMP14+EGFR; Future - Lipid panel; Future - Thyroid Panel With TSH; Future - CBC with Differential/Platelet; Future - Vitamin B12; Future - RPR; Future - MR Brain Wo Contrast; Future  2. Word finding difficulty - CMP14+EGFR; Future - Thyroid Panel With TSH; Future - CBC with Differential/Platelet; Future - Vitamin B12; Future - RPR; Future - MR Brain Wo Contrast; Future  3. Mixed hyperlipidemia He will come in for fasting lipid - CMP14+EGFR; Future - Lipid panel; Future  4. Essential hypertension - CMP14+EGFR; Future   No orders of the defined types were placed in this encounter.  Meds ordered this encounter  Medications   amLODipine (NORVASC) 5 MG tablet    Sig: Take 1 tablet (5 mg total) by mouth daily.    Dispense:  90 tablet    Refill:  3   atorvastatin (LIPITOR) 40 MG tablet    Sig: Take 1 tablet (40 mg total) by mouth daily at 6 PM.    Dispense:  90 tablet     Refill:  Greer, DO [place] [phone]

## 2019-11-28 ENCOUNTER — Other Ambulatory Visit: Payer: Medicare Other

## 2019-11-28 ENCOUNTER — Other Ambulatory Visit: Payer: Self-pay

## 2019-11-28 DIAGNOSIS — I10 Essential (primary) hypertension: Secondary | ICD-10-CM

## 2019-11-28 DIAGNOSIS — R4789 Other speech disturbances: Secondary | ICD-10-CM

## 2019-11-28 DIAGNOSIS — E782 Mixed hyperlipidemia: Secondary | ICD-10-CM

## 2019-11-28 DIAGNOSIS — R413 Other amnesia: Secondary | ICD-10-CM

## 2019-11-29 ENCOUNTER — Encounter: Payer: Self-pay | Admitting: Family Medicine

## 2019-11-29 LAB — CMP14+EGFR
ALT: 12 IU/L (ref 0–44)
AST: 15 IU/L (ref 0–40)
Albumin/Globulin Ratio: 1.9 (ref 1.2–2.2)
Albumin: 4.2 g/dL (ref 3.7–4.7)
Alkaline Phosphatase: 60 IU/L (ref 48–121)
BUN/Creatinine Ratio: 15 (ref 10–24)
BUN: 14 mg/dL (ref 8–27)
Bilirubin Total: 0.7 mg/dL (ref 0.0–1.2)
CO2: 20 mmol/L (ref 20–29)
Calcium: 9.2 mg/dL (ref 8.6–10.2)
Chloride: 107 mmol/L — ABNORMAL HIGH (ref 96–106)
Creatinine, Ser: 0.91 mg/dL (ref 0.76–1.27)
GFR calc Af Amer: 92 mL/min/{1.73_m2} (ref >59)
GFR calc non Af Amer: 80 mL/min/{1.73_m2} (ref >59)
Globulin, Total: 2.2 g/dL (ref 1.5–4.5)
Glucose: 91 mg/dL (ref 65–99)
Potassium: 4 mmol/L (ref 3.5–5.2)
Sodium: 142 mmol/L (ref 134–144)
Total Protein: 6.4 g/dL (ref 6.0–8.5)

## 2019-11-29 LAB — RPR: RPR Ser Ql: NONREACTIVE

## 2019-11-29 LAB — CBC WITH DIFFERENTIAL/PLATELET
Basophils Absolute: 0 10*3/uL (ref 0.0–0.2)
Basos: 1 %
EOS (ABSOLUTE): 0.1 10*3/uL (ref 0.0–0.4)
Eos: 3 %
Hematocrit: 45.4 % (ref 37.5–51.0)
Hemoglobin: 16 g/dL (ref 13.0–17.7)
Immature Grans (Abs): 0 10*3/uL (ref 0.0–0.1)
Immature Granulocytes: 0 %
Lymphocytes Absolute: 1.3 10*3/uL (ref 0.7–3.1)
Lymphs: 33 %
MCH: 33.5 pg — ABNORMAL HIGH (ref 26.6–33.0)
MCHC: 35.2 g/dL (ref 31.5–35.7)
MCV: 95 fL (ref 79–97)
Monocytes Absolute: 0.4 10*3/uL (ref 0.1–0.9)
Monocytes: 10 %
Neutrophils Absolute: 2 10*3/uL (ref 1.4–7.0)
Neutrophils: 53 %
Platelets: 165 10*3/uL (ref 150–450)
RBC: 4.78 x10E6/uL (ref 4.14–5.80)
RDW: 12.1 % (ref 11.6–15.4)
WBC: 3.8 10*3/uL (ref 3.4–10.8)

## 2019-11-29 LAB — LIPID PANEL
Chol/HDL Ratio: 3 ratio (ref 0.0–5.0)
Cholesterol, Total: 158 mg/dL (ref 100–199)
HDL: 52 mg/dL (ref >39)
LDL Chol Calc (NIH): 92 mg/dL (ref 0–99)
Triglycerides: 73 mg/dL (ref 0–149)
VLDL Cholesterol Cal: 14 mg/dL (ref 5–40)

## 2019-11-29 LAB — THYROID PANEL WITH TSH
Free Thyroxine Index: 2 (ref 1.2–4.9)
T3 Uptake Ratio: 25 % (ref 24–39)
T4, Total: 7.8 ug/dL (ref 4.5–12.0)
TSH: 2.42 u[IU]/mL (ref 0.450–4.500)

## 2019-11-29 LAB — VITAMIN B12: Vitamin B-12: 228 pg/mL — ABNORMAL LOW (ref 232–1245)

## 2019-11-30 ENCOUNTER — Other Ambulatory Visit: Payer: Self-pay

## 2019-11-30 ENCOUNTER — Ambulatory Visit (INDEPENDENT_AMBULATORY_CARE_PROVIDER_SITE_OTHER): Payer: Medicare Other

## 2019-11-30 DIAGNOSIS — E538 Deficiency of other specified B group vitamins: Secondary | ICD-10-CM

## 2019-11-30 MED ORDER — CYANOCOBALAMIN 1000 MCG/ML IJ SOLN
1000.0000 ug | Freq: Once | INTRAMUSCULAR | Status: AC
  Administered 2019-11-30: 1000 ug via INTRAMUSCULAR

## 2019-11-30 NOTE — Progress Notes (Signed)
Pt given B12 in left deltoid today without difficulty. He was made aware to start OTC B12 supplement per Dr. Lajuana Ripple. A 3 month follow up in November was scheduled.

## 2019-12-07 ENCOUNTER — Telehealth: Payer: Self-pay | Admitting: Family Medicine

## 2019-12-08 ENCOUNTER — Other Ambulatory Visit: Payer: Self-pay | Admitting: Family Medicine

## 2019-12-08 DIAGNOSIS — J301 Allergic rhinitis due to pollen: Secondary | ICD-10-CM

## 2019-12-08 NOTE — Telephone Encounter (Signed)
Lmtcb.

## 2019-12-08 NOTE — Telephone Encounter (Signed)
Patient has appt 12/26/2019  for MRI and just wanted to give you fyi

## 2019-12-08 NOTE — Telephone Encounter (Signed)
Pt called requesting to speak with nurse regarding update with MRI.

## 2019-12-12 ENCOUNTER — Encounter: Payer: Self-pay | Admitting: Family Medicine

## 2019-12-12 ENCOUNTER — Telehealth: Payer: Self-pay | Admitting: Family Medicine

## 2019-12-12 NOTE — Telephone Encounter (Signed)
Pt wants to ask Lajuana Ripple if its ok for me to get his flu shot this year with everything that going on with flu being bad this year. He does not want to take the flu shot and it affect the covid shot. He got his covid shot in Feb. Please call back

## 2019-12-12 NOTE — Telephone Encounter (Signed)
I've already responded to this

## 2019-12-29 ENCOUNTER — Ambulatory Visit
Admission: RE | Admit: 2019-12-29 | Discharge: 2019-12-29 | Disposition: A | Discharge status: Home / Self Care | Payer: Medicare Other | Source: Ambulatory Visit | Attending: Family Medicine | Admitting: Family Medicine

## 2019-12-29 ENCOUNTER — Encounter: Payer: Self-pay | Admitting: Family Medicine

## 2019-12-29 ENCOUNTER — Other Ambulatory Visit: Payer: Medicare Other

## 2019-12-29 ENCOUNTER — Other Ambulatory Visit: Payer: Self-pay

## 2019-12-29 DIAGNOSIS — R413 Other amnesia: Secondary | ICD-10-CM

## 2019-12-29 DIAGNOSIS — R4789 Other speech disturbances: Secondary | ICD-10-CM

## 2019-12-30 ENCOUNTER — Other Ambulatory Visit: Payer: Self-pay | Admitting: Family Medicine

## 2019-12-30 DIAGNOSIS — G319 Degenerative disease of nervous system, unspecified: Secondary | ICD-10-CM

## 2019-12-30 DIAGNOSIS — R413 Other amnesia: Secondary | ICD-10-CM

## 2019-12-30 DIAGNOSIS — I6789 Other cerebrovascular disease: Secondary | ICD-10-CM

## 2020-01-02 ENCOUNTER — Telehealth: Payer: Self-pay | Admitting: Family Medicine

## 2020-01-02 ENCOUNTER — Encounter: Payer: Self-pay | Admitting: Family Medicine

## 2020-01-06 ENCOUNTER — Other Ambulatory Visit: Payer: Self-pay

## 2020-01-06 ENCOUNTER — Ambulatory Visit (INDEPENDENT_AMBULATORY_CARE_PROVIDER_SITE_OTHER): Payer: Medicare Other | Admitting: Family Medicine

## 2020-01-06 ENCOUNTER — Encounter: Payer: Self-pay | Admitting: Family Medicine

## 2020-01-06 VITALS — BP 130/78 | HR 58 | Temp 98.1°F | Resp 20 | Ht 70.0 in | Wt 188.0 lb

## 2020-01-06 DIAGNOSIS — H6593 Unspecified nonsuppurative otitis media, bilateral: Secondary | ICD-10-CM | POA: Diagnosis not present

## 2020-01-06 DIAGNOSIS — H6121 Impacted cerumen, right ear: Secondary | ICD-10-CM | POA: Diagnosis not present

## 2020-01-06 MED ORDER — NEOMYCIN-POLYMYXIN-HC 1 % OT SOLN: 3.0000 [drp] | Freq: Four times a day (QID) | OTIC | 0 refills | Status: DC

## 2020-01-06 MED ORDER — AZITHROMYCIN 250 MG PO TABS: ORAL_TABLET | ORAL | 0 refills | Status: DC

## 2020-01-06 NOTE — Progress Notes (Signed)
Subjective:  Patient ID: David Guerrero, male    DOB: [birth year]  Age: 79 y.o. MRN: [id number]  CC: No chief complaint on file.   HPI JHASE CREPPEL presents for several days of itching in the ears.  He has not been swimming or had excessive water in the ears.  He denies loss of hearing.  He does not have any pain.  Depression screen [facility] 2/9 01/06/2020 11/25/2019 12/10/2018  Decreased Interest 0 0 0  Down, Depressed, Hopeless 0 0 0  PHQ - 2 Score 0 0 0  Altered sleeping - - -  Tired, decreased energy - - -  Change in appetite - - -  Feeling bad or failure about yourself  - - -  Trouble concentrating - - -  Moving slowly or fidgety/restless - - -  Suicidal thoughts - - -  PHQ-9 Score - - -    History Bertil has a past medical history of Cancer ([place]), Cataract (12/14/2008), and Hyperlipidemia.   He has a past surgical history that includes Eye surgery and Melanoma excision.   His family history includes Arthritis in his mother; Cancer in his father.He reports that he quit smoking about 41 years ago. His smoking use included cigarettes. He has a 5.00 pack-year smoking history. He has never used smokeless tobacco. He reports current alcohol use of about 2.0 - 3.0 standard drinks of alcohol per week. He reports that he does not use drugs.    ROS Review of Systems  Constitutional: Negative for fever.  HENT: Positive for ear pain (itching). Negative for ear discharge.   Respiratory: Negative for shortness of breath.   Cardiovascular: Negative for chest pain.  Musculoskeletal: Negative for arthralgias.  Skin: Negative for rash.    Objective:  BP 130/78   Pulse (!) 58   Temp 98.1 F (36.7 C) (Temporal)   Resp 20   Ht 5\' 10"  (1.778 m)   Wt 188 lb (85.3 kg)   SpO2 96%   BMI 26.98 kg/m   BP Readings from Last 3 Encounters:  01/06/20 130/78  11/25/19 125/69  12/28/18 132/70    Wt Readings from Last 3 Encounters:  01/06/20 188 lb (85.3 kg)  11/25/19 185 lb 6.4 oz (84.1 kg)   12/10/18 191 lb 9.6 oz (86.9 kg)     Physical Exam Vitals reviewed.  Constitutional:      Appearance: He is well-developed.  HENT:     Head: Normocephalic and atraumatic.     Right Ear: External ear normal.     Left Ear: Ear canal and external ear normal.     Ears:     Comments:   Initially there was excessive wax in the right external canal.  This was treated with lavage revealing mild amount of fluid behind the TM.  The left had no cerumen but there was some fluid noted behind the TM.    Nose: No congestion.  Eyes:     Pupils: Pupils are equal, round, and reactive to light.  Cardiovascular:     Rate and Rhythm: Normal rate and regular rhythm.     Heart sounds: No murmur heard.   Pulmonary:     Effort: No respiratory distress.     Breath sounds: Normal breath sounds.  Musculoskeletal:     Cervical back: Normal range of motion and neck supple.  Neurological:     Mental Status: He is alert and oriented to person, place, and time.       Assessment &  Plan:   Diagnoses and all orders for this visit:  Bilateral otitis media with effusion  Impacted cerumen of right ear  Other orders -     NEOMYCIN-POLYMYXIN-HYDROCORTISONE (CORTISPORIN) 1 % SOLN OTIC solution; Place 3 drops into both ears 4 (four) times daily. -     azithromycin (ZITHROMAX Z-PAK) 250 MG tablet; Take two right away Then one a day for the next 4 days.       I am having Mallie Mussel C. Naclerio start on NEOMYCIN-POLYMYXIN-HYDROCORTISONE and azithromycin. I am also having him maintain his loratadine, amLODipine, atorvastatin, fluticasone, and Cyanocobalamin (VITAMIN B 12 PO).  Allergies as of 01/06/2020   No Known Allergies     Medication List       Accurate as of January 06, 2020  8:33 PM. If you have any questions, ask your nurse or doctor.        amLODipine 5 MG tablet Commonly known as: NORVASC Take 1 tablet (5 mg total) by mouth daily.   atorvastatin 40 MG tablet Commonly known as: LIPITOR Take  1 tablet (40 mg total) by mouth daily at 6 PM.   azithromycin 250 MG tablet Commonly known as: Zithromax Z-Pak Take two right away Then one a day for the next 4 days. Started by: Claretta Fraise, MD   fluticasone 50 MCG/ACT nasal spray Commonly known as: FLONASE SPRAY 2 SPRAYS INTO EACH NOSTRIL EVERY DAY   loratadine 10 MG tablet Commonly known as: CLARITIN Take 1 tablet (10 mg total) by mouth daily.   NEOMYCIN-POLYMYXIN-HYDROCORTISONE 1 % Soln OTIC solution Commonly known as: CORTISPORIN Place 3 drops into both ears 4 (four) times daily. Started by: Claretta Fraise, MD   VITAMIN B 12 PO Take by mouth.        Follow-up: Return if symptoms worsen or fail to improve.  Claretta Fraise, M.D.

## 2020-01-23 ENCOUNTER — Ambulatory Visit: Payer: Medicare Other | Admitting: Family Medicine

## 2020-02-13 ENCOUNTER — Encounter: Payer: Self-pay | Admitting: Family Medicine

## 2020-03-01 ENCOUNTER — Other Ambulatory Visit: Payer: Self-pay

## 2020-03-01 ENCOUNTER — Encounter: Payer: Self-pay | Admitting: Family Medicine

## 2020-03-01 ENCOUNTER — Ambulatory Visit (INDEPENDENT_AMBULATORY_CARE_PROVIDER_SITE_OTHER): Payer: Medicare Other | Admitting: Family Medicine

## 2020-03-01 VITALS — BP 132/72 | HR 52 | Temp 97.7°F | Ht 70.0 in | Wt 192.2 lb

## 2020-03-01 DIAGNOSIS — G319 Degenerative disease of nervous system, unspecified: Secondary | ICD-10-CM

## 2020-03-01 DIAGNOSIS — E538 Deficiency of other specified B group vitamins: Secondary | ICD-10-CM

## 2020-03-01 DIAGNOSIS — R413 Other amnesia: Secondary | ICD-10-CM

## 2020-03-01 DIAGNOSIS — I6789 Other cerebrovascular disease: Secondary | ICD-10-CM

## 2020-03-01 NOTE — Progress Notes (Signed)
Subjective: CC: Follow-up B12 def, memory loss PCP: Janora Norlander, DO XVQ:[id number] David Guerrero is a 79 y.o. male presenting to clinic today for:  1. Memory loss/ Vit B12 deficiency Previously reported word finding difficulty.  Had metabolic evaluation and mild vit b12 deficiency noted, see above.  MRI was notable for cerebral atrophy and microvascular changes.  He has an appointment scheduled with Neuro in December.   He does report improvement in memory and cognition since B12 replacement.  He has been compliant with his replacement.  Sleep is good.  Mood has been good.  No falls   ROS: Per HPI  No Known Allergies Past Medical History:  Diagnosis Date  . Cancer ([place])    melanoma, removed apprx 2013, early stage per pt  . Cataract 12/14/2008  . Hyperlipidemia     Current Outpatient Medications:  .  amLODipine (NORVASC) 5 MG tablet, Take 1 tablet (5 mg total) by mouth daily., Disp: 90 tablet, Rfl: 3 .  atorvastatin (LIPITOR) 40 MG tablet, Take 1 tablet (40 mg total) by mouth daily at 6 PM., Disp: 90 tablet, Rfl: 3 .  azithromycin (ZITHROMAX Z-PAK) 250 MG tablet, Take two right away Then one a day for the next 4 days., Disp: 6 each, Rfl: 0 .  Cyanocobalamin (VITAMIN B 12 PO), Take by mouth., Disp: , Rfl:  .  fluticasone (FLONASE) 50 MCG/ACT nasal spray, SPRAY 2 SPRAYS INTO EACH NOSTRIL EVERY DAY, Disp: 48 mL, Rfl: 2 .  loratadine (CLARITIN) 10 MG tablet, Take 1 tablet (10 mg total) by mouth daily., Disp: 30 tablet, Rfl: 11 .  NEOMYCIN-POLYMYXIN-HYDROCORTISONE (CORTISPORIN) 1 % SOLN OTIC solution, Place 3 drops into both ears 4 (four) times daily., Disp: 10 mL, Rfl: 0 Social History   Socioeconomic History  . Marital status: Widowed    Spouse name: Not on file  . Number of children: 1  . Years of education: Not on file  . Highest education level: Bachelor's degree (e.g., BA, AB, BS)  Occupational History  . Occupation: Retired  . Occupation: Self Employed    Comment:  [name]  Tobacco Use  . Smoking status: Former Smoker    Packs/day: 0.50    Years: 10.00    Pack years: 5.00    Types: Cigarettes    Quit date: 12/11/1978    Years since quitting: 41.2  . Smokeless tobacco: Never Used  Vaping Use  . Vaping Use: Never used  Substance and Sexual Activity  . Alcohol use: Yes    Alcohol/week: 2.0 - 3.0 standard drinks    Types: 2 - 3 Glasses of wine per week  . Drug use: No  . Sexual activity: Not Currently  Other Topics Concern  . Not on file  Social History Narrative  . Not on file   Social Determinants of Health   Financial Resource Strain:   . Difficulty of Paying Living Expenses: Not on file  Food Insecurity:   . Worried About Charity fundraiser in the Last Year: Not on file  . Ran Out of Food in the Last Year: Not on file  Transportation Needs:   . Lack of Transportation (Medical): Not on file  . Lack of Transportation (Non-Medical): Not on file  Physical Activity:   . Days of Exercise per Week: Not on file  . Minutes of Exercise per Session: Not on file  Stress:   . Feeling of Stress : Not on file  Social Connections:   . Frequency of Communication  with Friends and Family: Not on file  . Frequency of Social Gatherings with Friends and Family: Not on file  . Attends Religious Services: Not on file  . Active Member of Clubs or Organizations: Not on file  . Attends Archivist Meetings: Not on file  . Marital Status: Not on file  Intimate Partner Violence:   . Fear of Current or Ex-Partner: Not on file  . Emotionally Abused: Not on file  . Physically Abused: Not on file  . Sexually Abused: Not on file   Family History  Problem Relation Age of Onset  . Arthritis Mother   . Cancer Father     Objective: Office vital signs reviewed. There were no vitals taken for this visit.  Physical Examination:  General: Awake, alert, well nourished, No acute distress HEENT: Normal; sclera white.  Cardio: regular rate and  rhythm, S1S2 heard, no murmurs appreciated Pulm: clear to auscultation bilaterally, no wheezes, rhonchi or rales; normal work of breathing on room air MSK: Normal gait. Neuro: Alert and oriented.  Follows commands.  See MMSE below  MMSE - Mini Mental State Exam 03/01/2020 11/25/2019 11/26/2017  Orientation to time 5 4 5   Orientation to Place 5 5 5   Registration 3 3 3   Attention/ Calculation 5 5 5   Recall 3 0 3  Language- name 2 objects 2 2 2   Language- repeat 1 1 1   Language- follow 3 step command 3 3 3   Language- read & follow direction 1 1 1   Write a sentence 1 1 1   Copy design 1 1 1   Total score 30 26 30    Assessment/ Plan: 79 y.o. male   1. Memory loss His memory has actually improved since our last visit.  I suspect that B12 deficiency may have been the cause of his symptoms.  He still wants to see the neurologist for checkup and will keep that appointment.  His daughter to accompany him at that visit  2. 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 B12 deficiency Check B12 level.  Continue B12 supplementation orally - Vitamin B12   No orders of the defined types were placed in this encounter.  No orders of the defined types were placed in this encounter.    Janora Norlander, DO [place] [phone]

## 2020-03-02 ENCOUNTER — Encounter: Payer: Self-pay | Admitting: Family Medicine

## 2020-03-02 LAB — VITAMIN B12: Vitamin B-12: 728 pg/mL (ref 232–1245)

## 2020-03-08 ENCOUNTER — Ambulatory Visit: Payer: Medicare Other | Admitting: Neurology

## 2020-03-21 ENCOUNTER — Ambulatory Visit: Payer: Medicare Other

## 2020-03-27 ENCOUNTER — Other Ambulatory Visit: Payer: Self-pay

## 2020-03-27 ENCOUNTER — Encounter: Payer: Self-pay | Admitting: Diagnostic Neuroimaging

## 2020-03-27 ENCOUNTER — Ambulatory Visit (INDEPENDENT_AMBULATORY_CARE_PROVIDER_SITE_OTHER): Payer: Medicare Other | Admitting: Diagnostic Neuroimaging

## 2020-03-27 VITALS — BP 142/73 | HR 68 | Ht 70.0 in | Wt 194.0 lb

## 2020-03-27 DIAGNOSIS — R413 Other amnesia: Secondary | ICD-10-CM | POA: Diagnosis not present

## 2020-03-27 NOTE — Progress Notes (Signed)
[facility]  PATIENT: David Guerrero DOB: 1940-06-28  REFERRING CLINICIAN: Janora Norlander, DO HISTORY FROM: patient  REASON FOR VISIT: new consult    HISTORICAL  CHIEF COMPLAINT:  Chief Complaint  Patient presents with  . Memory Loss    rm 7 New Pt, "previous memory score was 24 but I began B12 and memory/focusing are better"     HISTORY OF PRESENT ILLNESS:   79 year old male here for evaluation of memory loss.  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  Then patient's wife was diagnosed with cancer and passed away in 08/06/2017.  Around that time he was having significant stress and had forgotten to pay property tax and register the cars.  He was having some trouble with short-term memory and conversations.  Patient followed up with PCP and had B12 testing which was slightly low and started on replacement in the last few months, now patient feeling better.  Patient lives alone.  He takes care of all of his ADLs.  He walks several times a week.  He is avid in exercise and activity.  I spoke with his daughter via phone and reviewed his test results and information.  She has noticed some similar mild memory issues over the past 1 to 2 years.   REVIEW OF SYSTEMS: Full 14 system review of systems performed and negative with exception of: As per HPI.  ALLERGIES: No Known Allergies  HOME MEDICATIONS: Outpatient Medications Prior to Visit  Medication Sig Dispense Refill  . amLODipine (NORVASC) 5 MG tablet Take 1 tablet (5 mg total) by mouth daily. 90 tablet 3  . atorvastatin (LIPITOR) 40 MG tablet Take 1 tablet (40 mg total) by mouth daily at 6 PM. 90 tablet 3  . Cyanocobalamin (VITAMIN B 12 PO) Take by mouth.    . fluticasone (FLONASE) 50 MCG/ACT nasal spray SPRAY 2 SPRAYS INTO EACH NOSTRIL EVERY DAY 48 mL 2  . loratadine (CLARITIN) 10 MG  tablet Take 1 tablet (10 mg total) by mouth daily. 30 tablet 11   No facility-administered medications prior to visit.    PAST MEDICAL HISTORY: Past Medical History:  Diagnosis Date  . Cancer ([place])    melanoma, removed apprx 2011/08/07, early stage per pt  . Cataract 12/14/2008  . Hyperlipidemia     PAST SURGICAL HISTORY: Past Surgical History:  Procedure Laterality Date  . EYE SURGERY  08/06/08   Cataracts  . MELANOMA EXCISION     L upper arm    FAMILY HISTORY: Family History  Problem Relation Age of Onset  . Arthritis Mother   . Cancer Father        lung    SOCIAL HISTORY: Social History   Socioeconomic History  . Marital status: Widowed    Spouse name: Not on file  . Number of children: 1  . Years of education: Not on file  . Highest education level: Bachelor's degree (e.g., BA, AB, BS)  Occupational History  . Occupation: Retired  . Occupation: Self Employed    Comment: [name]  Tobacco Use  . Smoking status: Former Smoker    Packs/day: 0.50    Years: 10.00    Pack years: 5.00    Types: Cigarettes    Quit date: 12/10/1980    Years since quitting: 39.3  . Smokeless tobacco: Never Used  Vaping Use  . Vaping Use: Never  used  Substance and Sexual Activity  . Alcohol use: Yes    Alcohol/week: 2.0 - 3.0 standard drinks    Types: 2 - 3 Glasses of wine per week  . Drug use: No  . Sexual activity: Not Currently  Other Topics Concern  . Not on file  Social History Narrative   Lives alone   Social Determinants of Health   Financial Resource Strain:   . Difficulty of Paying Living Expenses: Not on file  Food Insecurity:   . Worried About Charity fundraiser in the Last Year: Not on file  . Ran Out of Food in the Last Year: Not on file  Transportation Needs:   . Lack of Transportation (Medical): Not on file  . Lack of Transportation (Non-Medical): Not on file  Physical Activity:   . Days of Exercise per Week: Not on file  . Minutes of Exercise per Session:  Not on file  Stress:   . Feeling of Stress : Not on file  Social Connections:   . Frequency of Communication with Friends and Family: Not on file  . Frequency of Social Gatherings with Friends and Family: Not on file  . Attends Religious Services: Not on file  . Active Member of Clubs or Organizations: Not on file  . Attends Archivist Meetings: Not on file  . Marital Status: Not on file  Intimate Partner Violence:   . Fear of Current or Ex-Partner: Not on file  . Emotionally Abused: Not on file  . Physically Abused: Not on file  . Sexually Abused: Not on file     PHYSICAL EXAM  GENERAL EXAM/CONSTITUTIONAL: Vitals:  Vitals:   03/27/20 1237  BP: (!) 142/73  Pulse: 68  Weight: 194 lb (88 kg)  Height: 5\' 10"  (1.778 m)     Body mass index is 27.84 kg/m. Wt Readings from Last 3 Encounters:  03/27/20 194 lb (88 kg)  03/01/20 192 lb 3.2 oz (87.2 kg)  01/06/20 188 lb (85.3 kg)     Patient is in no distress; well developed, nourished and groomed; neck is supple  CARDIOVASCULAR:  Examination of carotid arteries is normal; no carotid bruits  Regular rate and rhythm, no murmurs  Examination of peripheral vascular system by observation and palpation is normal  EYES:  Ophthalmoscopic exam of optic discs and posterior segments is normal; no papilledema or hemorrhages  No exam data present  MUSCULOSKELETAL:  Gait, strength, tone, movements noted in Neurologic exam below  NEUROLOGIC: MENTAL STATUS:  MMSE - [place] Exam 03/27/2020 03/01/2020 11/25/2019  Orientation to time 5 5 4   Orientation to Place 5 5 5   Registration 3 3 3   Attention/ Calculation 4 5 5   Recall 2 3 0  Language- name 2 objects 2 2 2   Language- repeat 1 1 1   Language- follow 3 step command 3 3 3   Language- read & follow direction 1 1 1   Write a sentence 1 1 1   Copy design 1 1 1   Total score 28 30 26     awake, alert, oriented to person, place and time  recent and remote  memory intact  normal attention and concentration  language fluent, comprehension intact, naming intact  fund of knowledge appropriate  CRANIAL NERVE:   2nd - no papilledema on fundoscopic exam  2nd, 3rd, 4th, 6th - pupils equal and reactive to light, visual fields full to confrontation, extraocular muscles intact, no nystagmus  5th - facial sensation symmetric  7th -  facial strength symmetric  8th - hearing intact  9th - palate elevates symmetrically, uvula midline  11th - shoulder shrug symmetric  12th - tongue protrusion midline  MOTOR:   normal bulk and tone, full strength in the BUE, BLE  SENSORY:   normal and symmetric to light touch, temperature, vibration  COORDINATION:   finger-nose-finger, fine finger movements normal  REFLEXES:   deep tendon reflexes TRACE and symmetric  GAIT/STATION:   narrow based gait     DIAGNOSTIC DATA (LABS, IMAGING, TESTING) - I reviewed patient records, labs, notes, testing and imaging myself where available.  Lab Results  Component Value Date   WBC 3.8 11/28/2019   HGB 16.0 11/28/2019   HCT 45.4 11/28/2019   MCV 95 11/28/2019   PLT 165 11/28/2019      Component Value Date/Time   NA 142 11/28/2019 0805   K 4.0 11/28/2019 0805   CL 107 (H) 11/28/2019 0805   CO2 20 11/28/2019 0805   GLUCOSE 91 11/28/2019 0805   GLUCOSE 95 10/19/2008 1022   BUN 14 11/28/2019 0805   CREATININE 0.91 11/28/2019 0805   CALCIUM 9.2 11/28/2019 0805   PROT 6.4 11/28/2019 0805   ALBUMIN 4.2 11/28/2019 0805   AST 15 11/28/2019 0805   ALT 12 11/28/2019 0805   ALKPHOS 60 11/28/2019 0805   BILITOT 0.7 11/28/2019 0805   GFRNONAA 80 11/28/2019 0805   GFRAA 92 11/28/2019 0805   Lab Results  Component Value Date   CHOL 158 11/28/2019   HDL 52 11/28/2019   LDLCALC 92 11/28/2019   TRIG 73 11/28/2019   CHOLHDL 3.0 11/28/2019   No results found for: HGBA1C Lab Results  Component Value Date   VITAMINB12 728 03/01/2020   Vitamin  B-12  Date Value Ref Range Status  03/01/2020 728 232 - 1,245 pg/mL Final  11/28/2019 228 (L) 232 - 1,245 pg/mL Final    Lab Results  Component Value Date   TSH 2.420 11/28/2019    12/29/19 MRI brain [I reviewed images myself and agree with interpretation. -VRP]  -No acute intracranial process. Remote left parietal microhemorrhage. -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 changes.    ASSESSMENT AND PLAN  79 y.o. year old male here with:  Dx:  1. 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    Penni Bombard, MD 18/05/9935, 1:69 PM Certified in Neurology, Neurophysiology and Neuroimaging  [facility] [address] [place], [place] [address] [phone]

## 2020-03-27 NOTE — Patient Instructions (Signed)
MILD MEMORY LOSS - daily physical activity / exercise (at least 15-30 minutes) - eat more plants / vegetables - increase social activities, brain stimulation, games, puzzles, hobbies, crafts, arts, music - aim for at least 7-8 hours sleep per night (or more) - avoid smoking and alcohol

## 2020-05-01 ENCOUNTER — Encounter: Payer: Self-pay | Admitting: Family Medicine

## 2020-06-04 ENCOUNTER — Ambulatory Visit: Payer: Medicare Other

## 2020-06-04 ENCOUNTER — Other Ambulatory Visit: Payer: Self-pay

## 2020-06-05 ENCOUNTER — Ambulatory Visit (INDEPENDENT_AMBULATORY_CARE_PROVIDER_SITE_OTHER): Payer: Medicare Other | Admitting: *Deleted

## 2020-06-05 DIAGNOSIS — Z23 Encounter for immunization: Secondary | ICD-10-CM

## 2020-06-06 ENCOUNTER — Ambulatory Visit (INDEPENDENT_AMBULATORY_CARE_PROVIDER_SITE_OTHER): Payer: Medicare Other | Admitting: *Deleted

## 2020-06-06 VITALS — BP 130/60 | Ht 70.0 in | Wt 194.0 lb

## 2020-06-06 DIAGNOSIS — Z0000 Encounter for general adult medical examination without abnormal findings: Secondary | ICD-10-CM

## 2020-06-06 NOTE — Progress Notes (Signed)
MEDICARE ANNUAL WELLNESS VISIT  06/06/2020  Telephone Visit Disclaimer This Medicare AWV was conducted by telephone due to national recommendations for restrictions regarding the COVID-19 Pandemic (e.g. social distancing).  I verified, using two identifiers, that I am speaking with David Guerrero or their authorized healthcare agent. I discussed the limitations, risks, security, and privacy concerns of performing an evaluation and management service by telephone and the potential availability of an in-person appointment in the future. The patient expressed understanding and agreed to proceed.  Location of Patient: in his home Location of Provider (nurse):  In office   Subjective:    David Guerrero is a 80 y.o. male patient of Janora Norlander, DO who had a Medicare Annual Wellness Visit today via telephone. David Guerrero is Retired and lives alone. he has 1 child. he reports that he is socially active and does interact with friends/family regularly. he is moderately physically active and enjoys reading, traveling and cooking.  Patient Care Team: Janora Norlander, DO as PCP - General (Family Medicine) Loney Loh, MD (Dermatology)  Advanced Directives 06/06/2020 11/26/2017  Does Patient Have a Medical Advance Directive? Yes Yes  Type of Paramedic of [place];Living will Living will  Does patient want to make changes to medical advance directive? No - Patient declined No - Patient declined  Copy of [place] in Chart? No - copy requested [facility] Utilization Over the Past 12 Months: # of hospitalizations or ER visits: 0 # of surgeries: 0  Review of Systems    Patient reports that his overall health is better compared to last year.  General ROS: negative  Patient Reported Readings (BP, Pulse, CBG, Weight, etc) BP 130/60   Ht 5\' 10"  (1.778 m)   Wt 194 lb (88 kg)   BMI 27.84 kg/m    Pain Assessment       Current  Medications & Allergies (verified) Allergies as of 06/06/2020   No Known Allergies     Medication List       Accurate as of June 06, 2020  8:28 AM. If you have any questions, ask your nurse or doctor.        amLODipine 5 MG tablet Commonly known as: NORVASC Take 1 tablet (5 mg total) by mouth daily.   atorvastatin 40 MG tablet Commonly known as: LIPITOR Take 1 tablet (40 mg total) by mouth daily at 6 PM.   fluticasone 50 MCG/ACT nasal spray Commonly known as: FLONASE SPRAY 2 SPRAYS INTO EACH NOSTRIL EVERY DAY   loratadine 10 MG tablet Commonly known as: CLARITIN Take 1 tablet (10 mg total) by mouth daily.   multivitamin with minerals Tabs tablet Take 1 tablet by mouth daily.   VITAMIN B 12 PO Take by mouth.       History (reviewed): Past Medical History:  Diagnosis Date  . Cancer ([place])    melanoma, removed apprx 2013, early stage per pt  . Cataract 12/14/2008  . Hyperlipidemia   . Memory deficit    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   Past Surgical History:  Procedure Laterality Date  . EYE SURGERY  2010   Cataracts  . MELANOMA EXCISION     L upper arm   Family History  Problem Relation Age of Onset  . Arthritis Mother   . Cancer Father        lung   Social History   Socioeconomic History  . Marital  status: Widowed    Spouse name: Not on file  . Number of children: 1  . Years of education: Not on file  . Highest education level: Bachelor's degree (e.g., BA, AB, BS)  Occupational History  . Occupation: Retired  . Occupation: Self Employed    Comment: [name]  Tobacco Use  . Smoking status: Former Smoker    Packs/day: 0.50    Years: 10.00    Pack years: 5.00    Types: Cigarettes    Quit date: 12/10/1980    Years since quitting: 39.5  . Smokeless tobacco: Never Used  Vaping Use  . Vaping Use: Never used  Substance and Sexual Activity  . Alcohol use: Yes    Alcohol/week: 2.0 - 3.0 standard drinks    Types: 2 - 3 Glasses of wine per week   . Drug use: No  . Sexual activity: Not Currently  Other Topics Concern  . Not on file  Social History Narrative   Lives alone   Social Determinants of Health   Financial Resource Strain: Not on file  Food Insecurity: Not on file  Transportation Needs: Not on file  Physical Activity: Not on file  Stress: Not on file  Social Connections: Not on file    Activities of Daily Living In your present state of health, do you have any difficulty performing the following activities: 06/06/2020  Hearing? N  Vision? Y  Comment glasses - reading  Difficulty concentrating or making decisions? Y  Comment in past - better on B12  Walking or climbing stairs? N  Dressing or bathing? N  Doing errands, shopping? N  Preparing Food and eating ? N  Using the Toilet? N  In the past six months, have you accidently leaked urine? N  Do you have problems with loss of bowel control? N  Managing your Medications? N  Managing your Finances? N  Housekeeping or managing your Housekeeping? N  Some recent data might be hidden    Patient Education/ Literacy    Exercise Current Exercise Habits: Home exercise routine, Type of exercise: walking, Time (Minutes): 30, Frequency (Times/Week): 5, Weekly Exercise (Minutes/Week): 150, Intensity: Mild, Exercise limited by: None identified  Diet Patient reports consuming 3 meals a day and 1 snack(s) a day Patient reports that his primary diet is: Regular Patient reports that he does have regular access to food.   Depression Screen PHQ 2/9 Scores 06/06/2020 03/01/2020 01/06/2020 11/25/2019 12/10/2018 05/25/2018 12/02/2017  PHQ - 2 Score 0 0 0 0 0 0 0  PHQ- 9 Score - - - - - 0 -     Fall Risk Fall Risk  06/06/2020 03/01/2020 01/06/2020 11/25/2019 12/10/2018  Falls in the past year? 0 0 0 0 0     Objective:  David Guerrero seemed alert and oriented and he participated appropriately during our telephone visit.  Blood Pressure Weight BMI  BP Readings from Last 3  Encounters:  06/06/20 130/60  03/27/20 (!) 142/73  03/01/20 132/72   Wt Readings from Last 3 Encounters:  06/06/20 194 lb (88 kg)  03/27/20 194 lb (88 kg)  03/01/20 192 lb 3.2 oz (87.2 kg)   BMI Readings from Last 1 Encounters:  06/06/20 27.84 kg/m    *Unable to obtain current vital signs, weight, and BMI due to telephone visit type  Hearing/Vision  . David Guerrero did not seem to have difficulty with hearing/understanding during the telephone conversation . Reports that he has not had a formal eye exam by an eye  care professional within the past year . Reports that he has not had a formal hearing evaluation within the past year *Unable to fully assess hearing and vision during telephone visit type  Cognitive Function: 6CIT Screen 06/06/2020  What Year? 0 points  What month? 0 points  What time? 0 points  Count back from 20 0 points  Months in reverse 0 points  Repeat phrase 0 points  Total Score 0   (Normal:0-7, Significant for Dysfunction: >8)  Normal Cognitive Function Screening: Yes   Immunization & Health Maintenance Record Immunization History  Administered Date(s) Administered  . Influenza, High Dose Seasonal PF 01/13/2015, 12/20/2015, 01/06/2017, 12/20/2019  . Influenza-Unspecified 01/06/2017, 12/03/2017, 12/09/2018  . Moderna Sars-Covid-2 Vaccination 05/03/2019, 06/03/2019, 02/15/2020  . Pneumococcal Conjugate-13 01/23/2015  . Pneumococcal Polysaccharide-23 06/14/2007  . Td 01/28/2017  . Zoster Recombinat (Shingrix) 06/05/2020    Health Maintenance  Topic Date Due  . COVID-19 Vaccine (4 - Booster for Moderna series) 08/15/2020  . TETANUS/TDAP  01/29/2027  . INFLUENZA VACCINE  Completed  . PNA vac Low Risk Adult  Completed       Assessment  This is a routine wellness examination for David Guerrero.  Health Maintenance: Due or Overdue There are no preventive care reminders to display for this patient.  David Guerrero does not need a referral for Community  Assistance: Care Management:   no Social Work:    no Prescription Assistance:  no Nutrition/Diabetes Education:  no   Plan:  Personalized Goals Goals Addressed            This Visit's Progress   . DIET - EAT MORE FRUITS AND VEGETABLES   On track    Eating less meat - more plant-base     . Prevent falls       Stay active      Personalized Health Maintenance & Screening Recommendations  up to date.  Lung Cancer Screening Recommended: no (Low Dose CT Chest recommended if Age 83-80 years, 30 pack-year currently smoking OR have quit w/in past 15 years) Hepatitis C Screening recommended: no HIV Screening recommended: no  Advanced Directives: Written information was not prepared per patient's request.  Referrals & Orders No orders of the defined types were placed in this encounter.   Follow-up Plan . Follow-up with Janora Norlander, DO as planned    I have personally reviewed and noted the following in the patient's chart:   . Medical and social history . Use of alcohol, tobacco or illicit drugs  . Current medications and supplements . Functional ability and status . Nutritional status . Physical activity . Advanced directives . List of other physicians . Hospitalizations, surgeries, and ER visits in previous 12 months . Vitals . Screenings to include cognitive, depression, and falls . Referrals and appointments  In addition, I have reviewed and discussed with David Guerrero certain preventive protocols, quality metrics, and best practice recommendations. A written personalized care plan for preventive services as well as general preventive health recommendations is available and can be mailed to the patient at his request.      [place] Dec  06/06/2020

## 2020-06-06 NOTE — Patient Instructions (Signed)
  David Guerrero , Thank you for taking time to come for your Medicare Wellness Visit. I appreciate your ongoing commitment to your health goals. Please review the following plan we discussed and let me know if I can assist you in the future.   These are the goals we discussed: Goals    . DIET - EAT MORE FRUITS AND VEGETABLES     Eating less meat - more plant-base     . Prevent falls     Stay active       This is a list of the screening recommended for you and due dates:  Health Maintenance  Topic Date Due  . COVID-19 Vaccine (4 - Booster for Moderna series) 08/15/2020  . Tetanus Vaccine  01/29/2027  . Flu Shot  Completed  . Pneumonia vaccines  Completed

## 2020-06-25 ENCOUNTER — Encounter: Payer: Self-pay | Admitting: Family Medicine

## 2020-06-25 DIAGNOSIS — J301 Allergic rhinitis due to pollen: Secondary | ICD-10-CM

## 2020-06-26 MED ORDER — LORATADINE 10 MG PO TABS: 10.0000 mg | ORAL_TABLET | Freq: Every day | ORAL | 7 refills | Status: DC

## 2020-07-16 ENCOUNTER — Ambulatory Visit: Payer: Medicare Other

## 2020-07-16 ENCOUNTER — Other Ambulatory Visit: Payer: Self-pay

## 2020-07-16 ENCOUNTER — Ambulatory Visit (INDEPENDENT_AMBULATORY_CARE_PROVIDER_SITE_OTHER): Payer: Medicare Other | Admitting: Family Medicine

## 2020-07-16 ENCOUNTER — Encounter: Payer: Self-pay | Admitting: Family Medicine

## 2020-07-16 VITALS — BP 126/65 | HR 62 | Temp 98.0°F | Wt 194.8 lb

## 2020-07-16 DIAGNOSIS — R413 Other amnesia: Secondary | ICD-10-CM | POA: Diagnosis not present

## 2020-07-16 DIAGNOSIS — E538 Deficiency of other specified B group vitamins: Secondary | ICD-10-CM | POA: Diagnosis not present

## 2020-07-16 DIAGNOSIS — I10 Essential (primary) hypertension: Secondary | ICD-10-CM

## 2020-07-16 DIAGNOSIS — E782 Mixed hyperlipidemia: Secondary | ICD-10-CM

## 2020-07-16 DIAGNOSIS — M8949 Other hypertrophic osteoarthropathy, multiple sites: Secondary | ICD-10-CM

## 2020-07-16 DIAGNOSIS — M159 Polyosteoarthritis, unspecified: Secondary | ICD-10-CM

## 2020-07-16 DIAGNOSIS — G319 Degenerative disease of nervous system, unspecified: Secondary | ICD-10-CM

## 2020-07-16 MED ORDER — DICLOFENAC SODIUM 1 % EX GEL: 2.0000 g | Freq: Four times a day (QID) | CUTANEOUS | 3 refills | Status: DC

## 2020-07-16 NOTE — Progress Notes (Signed)
Subjective: CC: HTN PCP: David Norlander, DO KDX:IPJAS C David Guerrero is a 80 y.o. male presenting to clinic today for:  1. HTN/hyperlipidemia Patient reports that his blood pressures are running systolics of 505L at home.  He thinks that his blood pressure cuff may be inaccurate however.  No reports of chest pain, shortness of breath or edema.  Compliant with Norvasc 5 mg daily and statin  2.  Bilateral hand pain He reports bilateral hand pain and stiffness.  This seems to be more prevalent over the last several weeks.  His right hand is more affected than the left.  Does not report any sensory changes but does note difficulty bending his fingers.  He has been really watching his salt closely but admits that he eats quite a bit of soy/tofu.  He has not paid much attention to any preservatives in those.  He has increased his plant-based diet quite a bit and avoids most meats.  No new medications.  He is not taking any ibuprofen, Tylenol or Aleve for pain in his hands.   ROS: Per HPI  No Known Allergies Past Medical History:  Diagnosis Date  . Cancer ([place])    melanoma, removed apprx 2013, early stage per pt  . Cataract 12/14/2008  . Hyperlipidemia   . 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  amLODipine (NORVASC) 5 MG tablet, Take 1 tablet (5 mg total) by mouth daily., Disp: 90 tablet, Rfl: 3 .  atorvastatin (LIPITOR) 40 MG tablet, Take 1 tablet (40 mg total) by mouth daily at 6 PM., Disp: 90 tablet, Rfl: 3 .  Cyanocobalamin (VITAMIN B 12 PO), Take by mouth., Disp: , Rfl:  .  fluticasone (FLONASE) 50 MCG/ACT nasal spray, SPRAY 2 SPRAYS INTO EACH NOSTRIL EVERY DAY, Disp: 48 mL, Rfl: 2 .  loratadine (CLARITIN) 10 MG tablet, Take 1 tablet (10 mg total) by mouth daily., Disp: 30 tablet, Rfl: 7 .  Multiple Vitamin (MULTIVITAMIN WITH MINERALS) TABS tablet, Take 1 tablet by mouth daily., Disp: , Rfl:  Social History   Socioeconomic History  .  Marital status: Widowed    Spouse name: Not on file  . Number of children: 1  . Years of education: Not on file  . Highest education level: Bachelor's degree (e.g., BA, AB, BS)  Occupational History  . Occupation: Retired  . Occupation: Self Employed    Comment: [name]  Tobacco Use  . Smoking status: Former Smoker    Packs/day: 0.50    Years: 10.00    Pack years: 5.00    Types: Cigarettes    Quit date: 12/10/1980    Years since quitting: 39.6  . Smokeless tobacco: Never Used  Vaping Use  . Vaping Use: Never used  Substance and Sexual Activity  . Alcohol use: Yes    Alcohol/week: 2.0 - 3.0 standard drinks    Types: 2 - 3 Glasses of wine per week  . Drug use: No  . Sexual activity: Not Currently  Other Topics Concern  . Not on file  Social History Narrative   Lives alone   Social Determinants of Health   Financial Resource Strain: Not on file  Food Insecurity: Not on file  Transportation Needs: Not on file  Physical Activity: Not on file  Stress: Not on file  Social Connections: Not on file  Intimate Partner Violence: Not on file   Family History  Problem Relation Age of Onset  .  Arthritis Mother   . Cancer Father        lung    Objective: Office vital signs reviewed. BP 126/65   Pulse 62   Temp 98 F (36.7 C) (Temporal)   Wt 194 lb 12.8 oz (88.4 kg)   SpO2 96%   BMI 27.95 kg/m   Physical Examination:  General: Awake, alert, well nourished, No acute distress HEENT: Normal; sclera white Cardio: regular rate and rhythm, S1S2 heard, no murmurs appreciated Pulm: clear to auscultation bilaterally, no wheezes, rhonchi or rales; normal work of breathing on room air Extremities: warm, well perfused, No edema, cyanosis or clubbing; +2 pulses bilaterally MSK: He does have evidence of arthritis in the PIP and DIP joints of bilateral hands.  No gross joint swelling, erythema or warmth.  Active range of motion is limited secondary to stiffness  however.  Assessment/ Plan: 80 y.o. male   Mixed hyperlipidemia - Plan: CMP14+EGFR, LDL Cholesterol, Direct  Essential hypertension  Primary osteoarthritis involving multiple joints - Plan: diclofenac Sodium (VOLTAREN) 1 % GEL  B12 deficiency - Plan: Vitamin B12, CBC  Cerebral atrophy (HCC)  Memory loss  Check direct LDL given nonfasting status, CMP.  Continue statin  Blood pressure is well controlled.  No changes to current regimen.  Advised him to bring in his cuff for evaluation against our monitor.  Start diclofenac topically.  I hesitate to place him on anything that might raise his blood pressure given his concerns of elevated blood pressures at home.  However, could certainly consider Naprosyn as needed if symptoms do not improve with topical NSAID.  Could also consider obtaining autoimmune labs at some point if symptoms are persistent or worsening  Check B12, CBC  Memory loss is stable.  He has done major lifestyle modifications in efforts to improve this.  No orders of the defined types were placed in this encounter.  No orders of the defined types were placed in this encounter.    David Norlander, DO [place] [phone]

## 2020-07-17 ENCOUNTER — Telehealth: Payer: Self-pay | Admitting: *Deleted

## 2020-07-17 ENCOUNTER — Encounter: Payer: Self-pay | Admitting: Family Medicine

## 2020-07-17 LAB — CMP14+EGFR
ALT: 18 IU/L (ref 0–44)
AST: 21 IU/L (ref 0–40)
Albumin/Globulin Ratio: 2 (ref 1.2–2.2)
Albumin: 4.5 g/dL (ref 3.7–4.7)
Alkaline Phosphatase: 71 IU/L (ref 44–121)
BUN/Creatinine Ratio: 19 (ref 10–24)
BUN: 17 mg/dL (ref 8–27)
Bilirubin Total: 0.6 mg/dL (ref 0.0–1.2)
CO2: 22 mmol/L (ref 20–29)
Calcium: 9.5 mg/dL (ref 8.6–10.2)
Chloride: 105 mmol/L (ref 96–106)
Creatinine, Ser: 0.91 mg/dL (ref 0.76–1.27)
Globulin, Total: 2.3 g/dL (ref 1.5–4.5)
Glucose: 85 mg/dL (ref 65–99)
Potassium: 4.2 mmol/L (ref 3.5–5.2)
Sodium: 143 mmol/L (ref 134–144)
Total Protein: 6.8 g/dL (ref 6.0–8.5)
eGFR: 85 mL/min/{1.73_m2} (ref >59)

## 2020-07-17 LAB — LDL CHOLESTEROL, DIRECT: LDL Direct: 65 mg/dL (ref 0–99)

## 2020-07-17 LAB — CBC
Hematocrit: 45.6 % (ref 37.5–51.0)
Hemoglobin: 16.1 g/dL (ref 13.0–17.7)
MCH: 32.9 pg (ref 26.6–33.0)
MCHC: 35.3 g/dL (ref 31.5–35.7)
MCV: 93 fL (ref 79–97)
Platelets: 167 10*3/uL (ref 150–450)
RBC: 4.9 x10E6/uL (ref 4.14–5.80)
RDW: 12.1 % (ref 11.6–15.4)
WBC: 6.6 10*3/uL (ref 3.4–10.8)

## 2020-07-17 LAB — VITAMIN B12: Vitamin B-12: 833 pg/mL (ref 232–1245)

## 2020-07-17 NOTE — Telephone Encounter (Signed)
DICLOFENAC SODIUM Gel Type of coverage approved: Prior Authorization This approval authorizes your coverage from 04/21/2020 - 07/17/2021, unless we notify you  otherwise, and as long as the following conditions apply: ? you remain enrolled in our Medicare Part D prescription drug plan, ? your physician or other prescriber continues to prescribe the medication for you, and ? the medication continues to be safe for treating your condition. Depending upon the strength and/or formulation of the drug prescribed by your physician,  different quantity limits or safety edits may apply. Please consult your Medicare Part D plan's  formulary for the specific quantity limit.  Pharmacy notified.

## 2020-07-17 NOTE — Telephone Encounter (Signed)
Key: [id number] - PA Case ID: [id number] - Rx #: [id number]  Drug Diclofenac Sodium 1% gel Form [employer] Electronic [place] Form [phone] NCPDP)  Sent to plan

## 2020-07-18 DIAGNOSIS — Z85828 Personal history of other malignant neoplasm of skin: Secondary | ICD-10-CM | POA: Diagnosis not present

## 2020-07-18 DIAGNOSIS — Z872 Personal history of diseases of the skin and subcutaneous tissue: Secondary | ICD-10-CM | POA: Diagnosis not present

## 2020-07-18 DIAGNOSIS — L821 Other seborrheic keratosis: Secondary | ICD-10-CM | POA: Diagnosis not present

## 2020-07-18 DIAGNOSIS — L814 Other melanin hyperpigmentation: Secondary | ICD-10-CM | POA: Diagnosis not present

## 2020-07-18 DIAGNOSIS — D225 Melanocytic nevi of trunk: Secondary | ICD-10-CM | POA: Diagnosis not present

## 2020-07-18 DIAGNOSIS — D485 Neoplasm of uncertain behavior of skin: Secondary | ICD-10-CM | POA: Diagnosis not present

## 2020-08-03 ENCOUNTER — Ambulatory Visit: Payer: Medicare Other

## 2020-08-06 ENCOUNTER — Ambulatory Visit (INDEPENDENT_AMBULATORY_CARE_PROVIDER_SITE_OTHER): Payer: Medicare Other

## 2020-08-06 ENCOUNTER — Other Ambulatory Visit: Payer: Self-pay

## 2020-08-06 DIAGNOSIS — Z23 Encounter for immunization: Secondary | ICD-10-CM

## 2020-08-06 NOTE — Progress Notes (Signed)
Patient was given his second Shingrix and tolerated well.

## 2020-08-12 ENCOUNTER — Other Ambulatory Visit: Payer: Self-pay | Admitting: Family Medicine

## 2020-08-12 DIAGNOSIS — J301 Allergic rhinitis due to pollen: Secondary | ICD-10-CM

## 2020-10-01 ENCOUNTER — Other Ambulatory Visit: Payer: Self-pay

## 2020-10-01 ENCOUNTER — Encounter: Payer: Self-pay | Admitting: Family

## 2020-10-01 ENCOUNTER — Ambulatory Visit (INDEPENDENT_AMBULATORY_CARE_PROVIDER_SITE_OTHER): Payer: Medicare Other | Admitting: Family

## 2020-10-01 VITALS — BP 135/67 | HR 61 | Temp 97.4°F | Ht 70.0 in | Wt 195.8 lb

## 2020-10-01 DIAGNOSIS — W57XXXA Bitten or stung by nonvenomous insect and other nonvenomous arthropods, initial encounter: Secondary | ICD-10-CM

## 2020-10-01 DIAGNOSIS — S40861A Insect bite (nonvenomous) of right upper arm, initial encounter: Secondary | ICD-10-CM | POA: Diagnosis not present

## 2020-10-01 MED ORDER — DOXYCYCLINE HYCLATE 100 MG PO TABS: 100.0000 mg | ORAL_TABLET | Freq: Two times a day (BID) | ORAL | 0 refills | Status: DC

## 2020-10-01 NOTE — Patient Instructions (Signed)
Dermatology (4th ed., pp. [phone]). Elsevier."> Rosana Berger' diseases of the skin (13th ed., pp. [phone]). Elsevier.">  Spider Bite Spider bites are not common. When spider bites do happen, most do not cause serious health problems. There are only a few types of spider bites that cancause serious health problems. What are the causes? A spider bite is often caused by a person accidentally making contact with aspider in a way that traps the spider against the skin. What increases the risk? You are more likely to be bitten by a spider if: You live in an area where spiders live, and you disturb their habitat. You work outdoors, such as a Engineer, production. You participate in certain outdoor activities, such as playing in leaves or hiking. What are the signs or symptoms? Symptoms may vary depending on the type of spider. Some spider bites may cause symptoms within 1 hour after the bite. For other spider bites, it may take 1-2 days for symptoms to develop. Common symptoms of this condition include: A raised area that is red. Redness and swelling around the area of the bite or bites. Discomfort or pain in the area of the bite. A few types of spiders, such as the black widow or the brown recluse, can inject poison (venom) into a bite wound. This venom causes more serious symptoms. Symptoms of a venomous spider bite vary and may include: Muscle cramps. Nausea, vomiting, or abdominal pain. A fever. A skin sore (lesion) that spreads. This can break into an open wound (skin ulcer). Light-headedness or dizziness. How is this diagnosed? This condition may be diagnosed based on your symptoms and a physical exam. Your health care provider will ask about the history of your injury and any details you may have about the spider. This may help to determine what type ofspider bit you. How is this treated? Many spider bites do not require treatment. If needed, this condition may be treated by: Icing and keeping  the bite area raised (elevated). Taking or applying over-the-counter or prescription medicines to help control symptoms such as pain and itching. Having a tetanus shot, if needed. Taking antibiotic medicine. Follow these instructions at home: Medicines Take or apply over-the-counter and prescription medicines only as told by your health care provider. If you were prescribed an antibiotic medicine, take or apply it as told by your health care provider. Do not stop using the antibiotic even if you start to feel better or if your condition improves. Managing pain and swelling  If directed, put ice on the bite area. To do this: Put ice in a plastic bag. Place a towel between your skin and the bag. Leave the ice on for 20 minutes, 2-3 times a day. Remove the ice if your skin turns bright red. This is very important. If you cannot feel pain, heat, or cold, you have a greater risk of damage to the area. Elevate the bite area above the level of your heart while you are sitting or lying down.  General instructions  Do not scratch the bite area. Keep the bite area clean and dry. Wash the bite area daily with soap and water as told by your health care provider. Keep all follow-up visits. This is important.  Contact a health care provider if: Your bite does not get better after 3 days of treatment. Your bite turns black or purple. You have increased redness, swelling, or pain at the site of the bite. Get help right away if: You develop shortness of breath  or chest pain. You have fluid, blood, or pus coming from the bite area. You have muscle cramps or painful muscle spasms. You develop abdominal pain, nausea, or vomiting. You feel unusually tired (fatigued) or sleepy. These symptoms may represent a serious problem that is an emergency. Do not wait to see if the symptoms will go away. Get medical help right away. Call your local emergency services (911 in the [place] Do not drive yourself to the  hospital. Summary Spider bites are not common. When spider bites do happen, most do not cause serious health problems. Take or apply over-the-counter and prescription medicines only as told by your health care provider. Keep the bite area clean and dry. Wash the bite area daily with soap and water as told by your health care provider. Contact a health care provider if you have increased redness, swelling, or pain at the site of the bite. Get help right away if you develop shortness of breath or chest pain. This information is not intended to replace advice given to you by your health care provider. Make sure you discuss any questions you have with your healthcare provider. Document Revised: 01/25/2020 Document Reviewed: 01/25/2020 Elsevier Patient Education  2022 [employer].

## 2020-10-01 NOTE — Progress Notes (Signed)
   Subjective:    Patient ID: David Guerrero, male    DOB: [birth year], 80 y.o.   MRN: [id number]  Chief Complaint  Patient presents with   Insect Bite    Right arm. [place] Thursday [place]     HPI Pt presents to the office today with some type of insect bite. He states he woke up Friday morning and noticed he had a "bite" on his right arm and it was itching.   States the redness has worsen and continues to have pruritis. Denies any pain or fever.   He has been taking benadryl and using OTC itch cream with mild relief.     Review of Systems  Skin:  Positive for rash.  All other systems reviewed and are negative.     Objective:   Physical Exam Vitals reviewed.  Constitutional:      General: He is not in acute distress.    Appearance: He is well-developed.  HENT:     Head: Normocephalic.     Right Ear: Tympanic membrane normal.     Left Ear: Tympanic membrane normal.  Eyes:     General:        Right eye: No discharge.        Left eye: No discharge.     Pupils: Pupils are equal, round, and reactive to light.  Neck:     Thyroid: No thyromegaly.  Cardiovascular:     Rate and Rhythm: Normal rate and regular rhythm.     Heart sounds: Normal heart sounds. No murmur heard. Pulmonary:     Effort: Pulmonary effort is normal. No respiratory distress.     Breath sounds: Normal breath sounds. No wheezing.  Abdominal:     General: Bowel sounds are normal. There is no distension.     Palpations: Abdomen is soft.     Tenderness: There is no abdominal tenderness.  Musculoskeletal:        General: Normal range of motion.     Cervical back: Normal range of motion and neck supple.  Skin:    General: Skin is warm and dry.     Findings: Erythema and rash present.     Comments: Erythemas rash on right upper arm approx 7X9 cm, warm to touch   Neurological:     Mental Status: He is alert and oriented to person, place, and time.     Cranial Nerves: No cranial nerve  deficit.     Deep Tendon Reflexes: Reflexes are normal and symmetric.  Psychiatric:        Behavior: Behavior normal.        Thought Content: Thought content normal.        Judgment: Judgment normal.           Assessment & Plan:  1. Insect bite of right upper arm, initial encounter Area marked Report increased redness, fever, tenderness, or discharge Cool compresses Limit sun exposure RTO if symptoms worsen or do not improve  - doxycycline (VIBRA-TABS) 100 MG tablet; Take 1 tablet (100 mg total) by mouth 2 (two) times daily.  Dispense: 20 tablet; Refill: 0  Evelina Dun, FNP

## 2020-10-03 ENCOUNTER — Encounter: Payer: Self-pay | Admitting: Nurse Practitioner

## 2020-10-03 ENCOUNTER — Ambulatory Visit (INDEPENDENT_AMBULATORY_CARE_PROVIDER_SITE_OTHER): Payer: Medicare Other | Admitting: Nurse Practitioner

## 2020-10-03 ENCOUNTER — Other Ambulatory Visit: Payer: Self-pay

## 2020-10-03 VITALS — BP 125/71 | HR 58 | Temp 98.1°F | Resp 20 | Ht 70.0 in | Wt 195.0 lb

## 2020-10-03 DIAGNOSIS — S40861D Insect bite (nonvenomous) of right upper arm, subsequent encounter: Secondary | ICD-10-CM

## 2020-10-03 DIAGNOSIS — W57XXXD Bitten or stung by nonvenomous insect and other nonvenomous arthropods, subsequent encounter: Secondary | ICD-10-CM

## 2020-10-03 MED ORDER — CEFTRIAXONE SODIUM 1 G IJ SOLR
1.0000 g | Freq: Once | INTRAMUSCULAR | Status: AC
  Administered 2020-10-03: 1 g via INTRAMUSCULAR

## 2020-10-03 NOTE — Progress Notes (Signed)
   Subjective:    Patient ID: David Guerrero, male    DOB: 1940/05/17, 80 y.o.   MRN: [id number]   Chief Complaint: insect bite  HPI Patient was seen on 10/01/20 by C. Hawks, FNP with insect bite on right arm. He was given doxycycline. Today the area in question has gotten redder and is spreading wider.     Review of Systems  Constitutional:  Negative for diaphoresis.  Eyes:  Negative for pain.  Respiratory:  Negative for shortness of breath.   Cardiovascular:  Negative for chest pain, palpitations and leg swelling.  Gastrointestinal:  Negative for abdominal pain.  Endocrine: Negative for polydipsia.  Skin:  Negative for rash.  Neurological:  Negative for dizziness, weakness and headaches.  Hematological:  Does not bruise/bleed easily.  All other systems reviewed and are negative.     Objective:   Physical Exam Vitals and nursing note reviewed.  Constitutional:      Appearance: Normal appearance.  Cardiovascular:     Rate and Rhythm: Normal rate and regular rhythm.     Heart sounds: Normal heart sounds.  Pulmonary:     Effort: Pulmonary effort is normal.     Breath sounds: Normal breath sounds.  Skin:    General: Skin is warm.     Coloration: Skin is not pale.     Findings: No erythema (area measuring 9/10cm- hot to touch).  Neurological:     Mental Status: He is alert.    BP 125/71   Pulse (!) 58   Temp 98.1 F (36.7 C) (Temporal)   Resp 20   Ht 5\' 10"  (1.778 m)   Wt 195 lb (88.5 kg)   BMI 27.98 kg/m         Assessment & Plan:   David Guerrero in today with chief complaint of Recheck spider bite (Seen Monday and does not look any better/)   1. Insect bite of right upper extremity, subsequent encounter Watch for spreading of redness Ruler given to patient Meds ordered this encounter  Medications   cefTRIAXone (ROCEPHIN) injection 1 g    Order Specific Question:   Antibiotic Indication:    Answer:   Cellulitis       The above assessment  and management plan was discussed with the patient. The patient verbalized understanding of and has agreed to the management plan. Patient is aware to call the clinic if symptoms persist or worsen. Patient is aware when to return to the clinic for a follow-up visit. Patient educated on when it is appropriate to go to the emergency department.   Mary-Margaret Hassell Done, FNP

## 2020-10-15 DIAGNOSIS — L905 Scar conditions and fibrosis of skin: Secondary | ICD-10-CM | POA: Diagnosis not present

## 2020-10-15 DIAGNOSIS — D235 Other benign neoplasm of skin of trunk: Secondary | ICD-10-CM | POA: Diagnosis not present

## 2020-10-26 DIAGNOSIS — D239 Other benign neoplasm of skin, unspecified: Secondary | ICD-10-CM | POA: Insufficient documentation

## 2020-10-30 DIAGNOSIS — Z23 Encounter for immunization: Secondary | ICD-10-CM | POA: Diagnosis not present

## 2020-11-29 ENCOUNTER — Other Ambulatory Visit: Payer: Self-pay | Admitting: Family Medicine

## 2020-11-29 DIAGNOSIS — I10 Essential (primary) hypertension: Secondary | ICD-10-CM

## 2020-12-01 ENCOUNTER — Other Ambulatory Visit: Payer: Self-pay | Admitting: Family Medicine

## 2020-12-01 DIAGNOSIS — J301 Allergic rhinitis due to pollen: Secondary | ICD-10-CM

## 2020-12-01 DIAGNOSIS — E782 Mixed hyperlipidemia: Secondary | ICD-10-CM

## 2020-12-07 ENCOUNTER — Telehealth: Payer: Self-pay | Admitting: Family Medicine

## 2020-12-07 NOTE — Telephone Encounter (Signed)
Please add lab orders and call when they are in.

## 2020-12-20 ENCOUNTER — Encounter: Payer: Self-pay | Admitting: Family Medicine

## 2020-12-24 ENCOUNTER — Other Ambulatory Visit: Payer: Self-pay | Admitting: Family Medicine

## 2020-12-24 DIAGNOSIS — I10 Essential (primary) hypertension: Secondary | ICD-10-CM

## 2020-12-26 ENCOUNTER — Other Ambulatory Visit: Payer: Self-pay | Admitting: Family Medicine

## 2020-12-26 DIAGNOSIS — E782 Mixed hyperlipidemia: Secondary | ICD-10-CM

## 2021-01-04 DIAGNOSIS — Z23 Encounter for immunization: Secondary | ICD-10-CM | POA: Diagnosis not present

## 2021-01-18 ENCOUNTER — Encounter: Payer: Self-pay | Admitting: Family Medicine

## 2021-01-18 ENCOUNTER — Ambulatory Visit (INDEPENDENT_AMBULATORY_CARE_PROVIDER_SITE_OTHER): Payer: Medicare Other | Admitting: Family Medicine

## 2021-01-18 ENCOUNTER — Other Ambulatory Visit: Payer: Self-pay

## 2021-01-18 VITALS — BP 139/69 | HR 57 | Temp 97.5°F | Ht 70.0 in | Wt 198.2 lb

## 2021-01-18 DIAGNOSIS — H60331 Swimmer's ear, right ear: Secondary | ICD-10-CM

## 2021-01-18 DIAGNOSIS — E782 Mixed hyperlipidemia: Secondary | ICD-10-CM

## 2021-01-18 DIAGNOSIS — I10 Essential (primary) hypertension: Secondary | ICD-10-CM | POA: Diagnosis not present

## 2021-01-18 LAB — BASIC METABOLIC PANEL
BUN/Creatinine Ratio: 12 (ref 10–24)
BUN: 11 mg/dL (ref 8–27)
CO2: 22 mmol/L (ref 20–29)
Calcium: 9.4 mg/dL (ref 8.6–10.2)
Chloride: 105 mmol/L (ref 96–106)
Creatinine, Ser: 0.89 mg/dL (ref 0.76–1.27)
Glucose: 99 mg/dL (ref 70–99)
Potassium: 4.2 mmol/L (ref 3.5–5.2)
Sodium: 142 mmol/L (ref 134–144)
eGFR: 87 mL/min/{1.73_m2} (ref >59)

## 2021-01-18 LAB — LIPID PANEL
Chol/HDL Ratio: 2.1 ratio (ref 0.0–5.0)
Cholesterol, Total: 148 mg/dL (ref 100–199)
HDL: 70 mg/dL (ref >39)
LDL Chol Calc (NIH): 66 mg/dL (ref 0–99)
Triglycerides: 58 mg/dL (ref 0–149)
VLDL Cholesterol Cal: 12 mg/dL (ref 5–40)

## 2021-01-18 MED ORDER — CIPROFLOXACIN-DEXAMETHASONE 0.3-0.1 % OT SUSP: 4.0000 [drp] | Freq: Two times a day (BID) | OTIC | 0 refills | Status: AC

## 2021-01-18 MED ORDER — AMLODIPINE BESYLATE 5 MG PO TABS: 5.0000 mg | ORAL_TABLET | Freq: Every day | ORAL | 3 refills | Status: DC

## 2021-01-18 MED ORDER — ATORVASTATIN CALCIUM 40 MG PO TABS: 40.0000 mg | ORAL_TABLET | Freq: Every day | ORAL | 3 refills | Status: DC

## 2021-01-18 NOTE — Addendum Note (Signed)
Addended by: Janora Norlander on: 01/18/2021 09:36 AM   Modules accepted: Orders, Level of Service

## 2021-01-18 NOTE — Progress Notes (Addendum)
Subjective: CC: Hypertension with hyperlipidemia PCP: Janora Norlander, DO David Guerrero is a 80 y.o. male presenting to clinic today for:  1.  Hypertension with hyperlipidemia Patient reports compliance with Lipitor, Norvasc.  No chest pain, shortness of breath.  He is actively exercising every single day except for Saturdays and Sundays and recently joined a gym.  He walks regularly as well.  He is now following a plant-based diet has not had any meat products since May.  2.  Ear fullness Patient reports some ear fullness and he would like to have his ears checked.  Denies any fevers, drainage or dizziness.  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 (NEEDS TO BE SEEN BEFORE NEXT REFILL), Disp: 30 tablet, Rfl: 0   atorvastatin (LIPITOR) 40 MG tablet, Take 1 tablet (40 mg total) by mouth daily at 6 PM., Disp: 30 tablet, Rfl: 0   Cyanocobalamin (VITAMIN B 12 PO), Take by mouth., Disp: , Rfl:    doxycycline (VIBRA-TABS) 100 MG tablet, Take 1 tablet (100 mg total) by mouth 2 (two) times daily., Disp: 20 tablet, Rfl: 0   fluticasone (FLONASE) 50 MCG/ACT nasal spray, SPRAY 2 SPRAYS INTO EACH NOSTRIL EVERY DAY, Disp: 48 mL, Rfl: 2   loratadine (CLARITIN) 10 MG tablet, TAKE 1 TABLET BY MOUTH EVERY DAY, Disp: 90 tablet, Rfl: 0   Multiple Vitamin (MULTIVITAMIN WITH MINERALS) TABS tablet, Take 1 tablet by mouth daily., Disp: , Rfl:  Social History   Socioeconomic History   Marital status: Widowed    Spouse name: Not on file   Number of children: 1   Years of education: Not on file   Highest education level: Bachelor's degree (e.g., BA, AB, BS)  Occupational History    Occupation: Retired   Occupation: Self Employed    Comment: [phone]  Tobacco Use   Smoking status: Former    Packs/day: 0.50    Years: 10.00    Pack years: 5.00    Types: Cigarettes    Quit date: 12/10/1980    Years since quitting: 40.1   Smokeless tobacco: Never  Vaping Use   Vaping Use: Never used  Substance and Sexual Activity   Alcohol use: Yes    Alcohol/week: 2.0 - 3.0 standard drinks    Types: 2 - 3 Glasses of wine per week   Drug use: No   Sexual activity: Not Currently  Other Topics Concern   Not on file  Social History Narrative   Lives alone   Social Determinants of Health   Financial Resource Strain: Not on file  Food Insecurity: Not on file  Transportation Needs: Not on file  Physical Activity: Not on file  Stress: Not on file  Social Connections: Not on file  Intimate Partner Violence: Not on file   Family History  Problem Relation Age of Onset   Arthritis Mother    Cancer Father        lung    Objective: Office vital signs reviewed. BP 139/69   Pulse (!) 57   Temp (!) 97.5 F (36.4 C) (Temporal)   Ht 5\' 10"  (1.778  m)   Wt 198 lb 3.2 oz (89.9 kg)   SpO2 95%   BMI 28.44 kg/m   Physical Examination:  General: Awake, alert, well nourished, No acute distress HEENT: Normal; sclera white.  Moist mucous membranes.  No carotid bruits.  Right ear is inflamed and has creamy discharge within the external auditory canal.  TM intact without bulging or erythema.  Left TM normal Cardio: regular rate and rhythm, S1S2 heard, no murmurs appreciated Pulm: clear to auscultation bilaterally, no wheezes, rhonchi or rales; normal work of breathing on room air GI: soft, non-tender, non-distended, bowel sounds present x4, no hepatomegaly, no splenomegaly, no masses Extremities: warm, well perfused, No edema, cyanosis or clubbing; +2 pulses bilaterally MSK: Normal gait and station  Assessment/ Plan: 80 y.o. male   Mixed hyperlipidemia - Plan: Lipid Panel,  atorvastatin (LIPITOR) 40 MG tablet  Essential hypertension - Plan: Basic Metabolic Panel, amLODipine (NORVASC) 5 MG tablet  Acute swimmer's ear of right side - Plan: ciprofloxacin-dexamethasone (CIPRODEX) OTIC suspension  Continue statin.  Check fasting lipid panel.  Liver function tests were normal in March  Blood pressure well controlled.  Continue current regimen.  Check BMP.  Doing excellent job with lifestyle modification and is doing strengthening exercises and following a plant-based diet.  I think this is awesome to continue reducing his cardiovascular risk.  Right ear looks like it has an acute otitis externa.  Ciprodex prescribed.  No orders of the defined types were placed in this encounter.  No orders of the defined types were placed in this encounter.    Janora Norlander, DO [place] [phone]

## 2021-01-22 DIAGNOSIS — Z23 Encounter for immunization: Secondary | ICD-10-CM | POA: Diagnosis not present

## 2021-02-12 ENCOUNTER — Other Ambulatory Visit: Payer: Self-pay

## 2021-02-12 ENCOUNTER — Encounter: Payer: Self-pay | Admitting: Family Medicine

## 2021-02-12 ENCOUNTER — Ambulatory Visit (INDEPENDENT_AMBULATORY_CARE_PROVIDER_SITE_OTHER): Payer: Medicare Other | Admitting: Family Medicine

## 2021-02-12 VITALS — BP 136/66 | HR 64 | Temp 97.4°F | Ht 70.0 in | Wt 197.8 lb

## 2021-02-12 DIAGNOSIS — S51811A Laceration without foreign body of right forearm, initial encounter: Secondary | ICD-10-CM

## 2021-02-12 DIAGNOSIS — L739 Follicular disorder, unspecified: Secondary | ICD-10-CM

## 2021-02-12 NOTE — Progress Notes (Signed)
Subjective:  Patient ID: David Guerrero, male    DOB: 1941/02/17, 80 y.o.   MRN: [id number]  Patient Care Team: Janora Norlander, DO as PCP - General (Family Medicine) Loney Loh, MD (Dermatology)   Chief Complaint:  Facial Pain (Below left nare/Wound check right forearm/)   HPI: David Guerrero is a 80 y.o. male presenting on 02/12/2021 for Facial Pain (Below left nare/Wound check right forearm/)   Pt presents today with for evaluation of a bump on his face and a wound to his right forearm. Pt states he noticed a tender bump under his left nostril several days ago and it does not seem to be improving. He has not tried anything for this. No drainage, slight erythema and swelling.  He states he was moving furniture yesterday and injured his right forearm. States he cleaned the area with soap and water and applied a bandage.     Relevant past medical, surgical, family, and social history reviewed and updated as indicated.  Allergies and medications reviewed and updated. Data reviewed: Chart in Epic.   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    Past Surgical History:  Procedure Laterality Date   EYE SURGERY  2010   Cataracts   MELANOMA EXCISION     L upper arm    Social History   Socioeconomic History   Marital status: Widowed    Spouse name: Not on file   Number of children: 1   Years of education: Not on file   Highest education level: Bachelor's degree (e.g., BA, AB, BS)  Occupational History   Occupation: Retired   Occupation: Self Employed    Comment: [phone]  Tobacco Use   Smoking status: Former    Packs/day: 0.50    Years: 10.00    Pack years: 5.00    Types: Cigarettes    Quit date: 12/10/1980    Years since quitting: 40.2   Smokeless tobacco: Never  Vaping Use   Vaping Use:  Never used  Substance and Sexual Activity   Alcohol use: Yes    Alcohol/week: 2.0 - 3.0 standard drinks    Types: 2 - 3 Glasses of wine per week   Drug use: No   Sexual activity: Not Currently  Other Topics Concern   Not on file  Social History Narrative   Lives alone   Social Determinants of Health   Financial Resource Strain: Not on file  Food Insecurity: Not on file  Transportation Needs: Not on file  Physical Activity: Not on file  Stress: Not on file  Social Connections: Not on file  Intimate Partner Violence: Not on file    Outpatient Encounter Medications as of 02/12/2021  Medication Sig   amLODipine (NORVASC) 5 MG tablet Take 1 tablet (5 mg total) by mouth daily.   atorvastatin (LIPITOR) 40 MG tablet Take 1 tablet (40 mg total) by mouth daily at 6 PM.   Cyanocobalamin (VITAMIN B 12 PO) Take by mouth.   fluticasone (FLONASE) 50 MCG/ACT nasal spray SPRAY 2 SPRAYS INTO EACH NOSTRIL EVERY DAY   loratadine (CLARITIN) 10 MG tablet TAKE 1 TABLET BY MOUTH EVERY DAY   Multiple Vitamin (MULTIVITAMIN WITH MINERALS) TABS tablet Take 1 tablet by mouth daily.   No facility-administered  encounter medications on file as of 02/12/2021.    No Known Allergies  Review of Systems  Constitutional:  Negative for activity change, appetite change, chills, diaphoresis, fatigue, fever and unexpected weight change.  HENT: Negative.    Eyes: Negative.   Respiratory:  Negative for cough, chest tightness and shortness of breath.   Cardiovascular:  Negative for chest pain, palpitations and leg swelling.  Gastrointestinal:  Negative for blood in stool, constipation, diarrhea, nausea and vomiting.  Endocrine: Negative.   Genitourinary:  Negative for dysuria, frequency and urgency.  Musculoskeletal:  Negative for arthralgias and myalgias.  Skin:  Positive for color change and wound. Negative for pallor and rash.  Allergic/Immunologic: Negative.   Neurological:  Negative for dizziness, weakness  and headaches.  Hematological: Negative.   Psychiatric/Behavioral:  Negative for confusion, hallucinations, sleep disturbance and suicidal ideas.   All other systems reviewed and are negative.      Objective:  BP 136/66   Pulse 64   Temp (!) 97.4 F (36.3 C)   Ht _0  (1.778 m)   Wt 197 lb 12.8 oz (89.7 kg)   SpO2 96%   BMI 28.38 kg/m    Wt Readings from Last 3 Encounters:  02/12/21 197 lb 12.8 oz (89.7 kg)  01/18/21 198 lb 3.2 oz (89.9 kg)  10/03/20 195 lb (88.5 kg)    Physical Exam Vitals and nursing note reviewed.  Constitutional:      General: He is not in acute distress.    Appearance: Normal appearance. He is well-developed, well-groomed and overweight. He is not ill-appearing, toxic-appearing or diaphoretic.  HENT:     Head: Normocephalic and atraumatic.     Jaw: There is normal jaw occlusion.     Right Ear: Hearing normal.     Left Ear: Hearing normal.     Nose: Nose normal.     Mouth/Throat:     Lips: Pink.     Mouth: Mucous membranes are moist.     Pharynx: Oropharynx is clear. Uvula midline.  Eyes:     General: Lids are normal.     Extraocular Movements: Extraocular movements intact.     Conjunctiva/sclera: Conjunctivae normal.     Pupils: Pupils are equal, round, and reactive to light.  Neck:     Thyroid: No thyroid mass, thyromegaly or thyroid tenderness.     Vascular: No carotid bruit or JVD.     Trachea: Trachea and phonation normal.  Cardiovascular:     Rate and Rhythm: Normal rate and regular rhythm.     Chest Wall: PMI is not displaced.     Pulses: Normal pulses.     Heart sounds: Normal heart sounds. No murmur heard.   No friction rub. No gallop.  Pulmonary:     Effort: Pulmonary effort is normal. No respiratory distress.     Breath sounds: Normal breath sounds. No wheezing.  Abdominal:     General: Bowel sounds are normal. There is no distension or abdominal bruit.     Palpations: Abdomen is soft. There is no hepatomegaly or  splenomegaly.     Tenderness: There is no abdominal tenderness. There is no right CVA tenderness or left CVA tenderness.     Hernia: No hernia is present.  Musculoskeletal:        General: Normal range of motion.     Cervical back: Normal range of motion and neck supple.     Right lower leg: No edema.     Left lower leg: No  edema.  Lymphadenopathy:     Cervical: No cervical adenopathy.  Skin:    General: Skin is warm and dry.     Capillary Refill: Capillary refill takes less than 2 seconds.     Coloration: Skin is not cyanotic, jaundiced or pale.     Findings: Ecchymosis, erythema, signs of injury, laceration, rash and wound present. No abrasion, abscess, acne, bruising, burn, lesion or petechiae. Rash is papular.       Neurological:     General: No focal deficit present.     Mental Status: He is alert and oriented to person, place, and time.     Sensory: Sensation is intact.     Motor: Motor function is intact.     Coordination: Coordination is intact.     Gait: Gait is intact.     Deep Tendon Reflexes: Reflexes are normal and symmetric.  Psychiatric:        Attention and Perception: Attention and perception normal.        Mood and Affect: Mood and affect normal.        Speech: Speech normal.        Behavior: Behavior normal. Behavior is cooperative.        Thought Content: Thought content normal.        Cognition and Memory: Cognition and memory normal.        Judgment: Judgment normal.    Results for orders placed or performed in visit on 01/18/21  Lipid Panel  Result Value Ref Range   Cholesterol, Total 148 100 - 199 mg/dL   Triglycerides 58 0 - 149 mg/dL   HDL 70 >39 mg/dL   VLDL Cholesterol Cal 12 5 - 40 mg/dL   LDL Chol Calc (NIH) 66 0 - 99 mg/dL   Chol/HDL Ratio 2.1 0.0 - 5.0 ratio  Basic Metabolic Panel  Result Value Ref Range   Glucose 99 70 - 99 mg/dL   BUN 11 8 - 27 mg/dL   Creatinine, Ser 0.89 0.76 - 1.27 mg/dL   eGFR 87 >59 mL/min/1.73   BUN/Creatinine  Ratio 12 10 - 24   Sodium 142 134 - 144 mmol/L   Potassium 4.2 3.5 - 5.2 mmol/L   Chloride 105 96 - 106 mmol/L   CO2 22 20 - 29 mmol/L   Calcium 9.4 8.6 - 10.2 mg/dL       Pertinent labs & imaging results that were available during my care of the patient were reviewed by me and considered in my medical decision making.  Assessment & Plan:  Demarkis was seen today for facial pain.  Diagnoses and all orders for this visit:  Skin tear of right forearm without complication, initial encounter Wound care discussed in detail. Wound cleansed and dressed in office. Tetanus vaccination is up to date.   Folliculitis Ingrown hair removed from under skin in office with 25 G needle. Symptomatic care discussed in detail. Pt aware to report any new, worsening, or persistent symptoms.    Continue all other maintenance medications.  Follow up plan: Return if symptoms worsen or fail to improve.   Continue healthy lifestyle choices, including diet (rich in fruits, vegetables, and lean proteins, and low in salt and simple carbohydrates) and exercise (at least 30 minutes of moderate physical activity daily).  Educational handout given for wound care, folliculitis  The above assessment and management plan was discussed with the patient. The patient verbalized understanding of and has agreed to the management plan. Patient is aware to  call the clinic if they develop any new symptoms or if symptoms persist or worsen. Patient is aware when to return to the clinic for a follow-up visit. Patient educated on when it is appropriate to go to the emergency department.   Monia Pouch, FNP-C [facility] [phone]

## 2021-02-28 ENCOUNTER — Other Ambulatory Visit: Payer: Self-pay | Admitting: Family Medicine

## 2021-02-28 DIAGNOSIS — J301 Allergic rhinitis due to pollen: Secondary | ICD-10-CM

## 2021-06-05 DIAGNOSIS — L814 Other melanin hyperpigmentation: Secondary | ICD-10-CM | POA: Diagnosis not present

## 2021-06-05 DIAGNOSIS — Z872 Personal history of diseases of the skin and subcutaneous tissue: Secondary | ICD-10-CM | POA: Diagnosis not present

## 2021-06-05 DIAGNOSIS — L821 Other seborrheic keratosis: Secondary | ICD-10-CM | POA: Diagnosis not present

## 2021-06-05 DIAGNOSIS — Z85820 Personal history of malignant melanoma of skin: Secondary | ICD-10-CM | POA: Diagnosis not present

## 2021-06-05 DIAGNOSIS — D485 Neoplasm of uncertain behavior of skin: Secondary | ICD-10-CM | POA: Diagnosis not present

## 2021-06-05 DIAGNOSIS — Z85828 Personal history of other malignant neoplasm of skin: Secondary | ICD-10-CM | POA: Diagnosis not present

## 2021-06-05 DIAGNOSIS — L820 Inflamed seborrheic keratosis: Secondary | ICD-10-CM | POA: Diagnosis not present

## 2021-06-05 DIAGNOSIS — D229 Melanocytic nevi, unspecified: Secondary | ICD-10-CM | POA: Diagnosis not present

## 2021-06-05 DIAGNOSIS — Z86006 Personal history of melanoma in-situ: Secondary | ICD-10-CM | POA: Diagnosis not present

## 2021-06-07 ENCOUNTER — Ambulatory Visit (INDEPENDENT_AMBULATORY_CARE_PROVIDER_SITE_OTHER): Payer: Medicare Other

## 2021-06-07 VITALS — Wt 193.0 lb

## 2021-06-07 DIAGNOSIS — Z0000 Encounter for general adult medical examination without abnormal findings: Secondary | ICD-10-CM

## 2021-06-07 NOTE — Patient Instructions (Signed)
[name] , Thank you for taking time to come for your Medicare Wellness Visit. I appreciate your ongoing commitment to your health goals. Please review the following plan we discussed and let me know if I can assist you in the future.   Screening recommendations/referrals: Colonoscopy: No longer required Recommended yearly ophthalmology/optometry visit for glaucoma screening and checkup Recommended yearly dental visit for hygiene and checkup  Vaccinations: Influenza vaccine: Done 01/11/2021 - Repeat annually  Pneumococcal vaccine: Done 06/14/2007 & 01/23/2015 Tdap vaccine: Done 01/28/2017 - Repeat in 10 years Shingles vaccine: Done 06/05/2020 & 08/06/2020   Covid-19: Done 05/03/2019, 06/03/2019, 02/15/2020 10/30/2020, & 01/22/2021  Advanced directives: Please bring a copy of your health care power of attorney and living will to the office to be added to your chart at your convenience.   Conditions/risks identified: Keep up the great work!   Next appointment: Follow up in one year for your annual wellness visit.   Preventive Care 2 Years and Older, Male  Preventive care refers to lifestyle choices and visits with your health care provider that can promote health and wellness. What does preventive care include? A yearly physical exam. This is also called an annual well check. Dental exams once or twice a year. Routine eye exams. Ask your health care provider how often you should have your eyes checked. Personal lifestyle choices, including: Daily care of your teeth and gums. Regular physical activity. Eating a healthy diet. Avoiding tobacco and drug use. Limiting alcohol use. Practicing safe sex. Taking low doses of aspirin every day. Taking vitamin and mineral supplements as recommended by your health care provider. What happens during an annual well check? The services and screenings done by your health care provider during your annual well check will depend on your age, overall  health, lifestyle risk factors, and family history of disease. Counseling  Your health care provider may ask you questions about your: Alcohol use. Tobacco use. Drug use. Emotional well-being. Home and relationship well-being. Sexual activity. Eating habits. History of falls. Memory and ability to understand (cognition). Work and work Statistician. Screening  You may have the following tests or measurements: Height, weight, and BMI. Blood pressure. Lipid and cholesterol levels. These may be checked every 5 years, or more frequently if you are over 12 years old. Skin check. Lung cancer screening. You may have this screening every year starting at age 38 if you have a 30-pack-year history of smoking and currently smoke or have quit within the past 15 years. Fecal occult blood test (FOBT) of the stool. You may have this test every year starting at age 6. Flexible sigmoidoscopy or colonoscopy. You may have a sigmoidoscopy every 5 years or a colonoscopy every 10 years starting at age 63. Prostate cancer screening. Recommendations will vary depending on your family history and other risks. Hepatitis C blood test. Hepatitis B blood test. Sexually transmitted disease (STD) testing. Diabetes screening. This is done by checking your blood sugar (glucose) after you have not eaten for a while (fasting). You may have this done every 1-3 years. Abdominal aortic aneurysm (AAA) screening. You may need this if you are a current or former smoker. Osteoporosis. You may be screened starting at age 45 if you are at high risk. Talk with your health care provider about your test results, treatment options, and if necessary, the need for more tests. Vaccines  Your health care provider may recommend certain vaccines, such as: Influenza vaccine. This is recommended every year. Tetanus, diphtheria, and acellular  pertussis (Tdap, Td) vaccine. You may need a Td booster every 10 years. Zoster vaccine. You may  need this after age 86. Pneumococcal 13-valent conjugate (PCV13) vaccine. One dose is recommended after age 24. Pneumococcal polysaccharide (PPSV23) vaccine. One dose is recommended after age 54. Talk to your health care provider about which screenings and vaccines you need and how often you need them. This information is not intended to replace advice given to you by your health care provider. Make sure you discuss any questions you have with your health care provider. Document Released: 05/04/2015 Document Revised: 12/26/2015 Document Reviewed: 02/06/2015 Elsevier Interactive Patient Education  2017 [employer] Prevention in the Home Falls can cause injuries. They can happen to people of all ages. There are many things you can do to make your home safe and to help prevent falls. What can I do on the outside of my home? Regularly fix the edges of walkways and driveways and fix any cracks. Remove anything that might make you trip as you walk through a door, such as a raised step or threshold. Trim any bushes or trees on the path to your home. Use bright outdoor lighting. Clear any walking paths of anything that might make someone trip, such as rocks or tools. Regularly check to see if handrails are loose or broken. Make sure that both sides of any steps have handrails. Any raised decks and porches should have guardrails on the edges. Have any leaves, snow, or ice cleared regularly. Use sand or salt on walking paths during winter. Clean up any spills in your garage right away. This includes oil or grease spills. What can I do in the bathroom? Use night lights. Install grab bars by the toilet and in the tub and shower. Do not use towel bars as grab bars. Use non-skid mats or decals in the tub or shower. If you need to sit down in the shower, use a plastic, non-slip stool. Keep the floor dry. Clean up any water that spills on the floor as soon as it happens. Remove soap buildup in  the tub or shower regularly. Attach bath mats securely with double-sided non-slip rug tape. Do not have throw rugs and other things on the floor that can make you trip. What can I do in the bedroom? Use night lights. Make sure that you have a light by your bed that is easy to reach. Do not use any sheets or blankets that are too big for your bed. They should not hang down onto the floor. Have a firm chair that has side arms. You can use this for support while you get dressed. Do not have throw rugs and other things on the floor that can make you trip. What can I do in the kitchen? Clean up any spills right away. Avoid walking on wet floors. Keep items that you use a lot in easy-to-reach places. If you need to reach something above you, use a strong step stool that has a grab bar. Keep electrical cords out of the way. Do not use floor polish or wax that makes floors slippery. If you must use wax, use non-skid floor wax. Do not have throw rugs and other things on the floor that can make you trip. What can I do with my stairs? Do not leave any items on the stairs. Make sure that there are handrails on both sides of the stairs and use them. Fix handrails that are broken or loose. Make sure that handrails  are as long as the stairways. Check any carpeting to make sure that it is firmly attached to the stairs. Fix any carpet that is loose or worn. Avoid having throw rugs at the top or bottom of the stairs. If you do have throw rugs, attach them to the floor with carpet tape. Make sure that you have a light switch at the top of the stairs and the bottom of the stairs. If you do not have them, ask someone to add them for you. What else can I do to help prevent falls? Wear shoes that: Do not have high heels. Have rubber bottoms. Are comfortable and fit you well. Are closed at the toe. Do not wear sandals. If you use a stepladder: Make sure that it is fully opened. Do not climb a closed  stepladder. Make sure that both sides of the stepladder are locked into place. Ask someone to hold it for you, if possible. Clearly mark and make sure that you can see: Any grab bars or handrails. First and last steps. Where the edge of each step is. Use tools that help you move around (mobility aids) if they are needed. These include: Canes. Walkers. Scooters. Crutches. Turn on the lights when you go into a dark area. Replace any light bulbs as soon as they burn out. Set up your furniture so you have a clear path. Avoid moving your furniture around. If any of your floors are uneven, fix them. If there are any pets around you, be aware of where they are. Review your medicines with your doctor. Some medicines can make you feel dizzy. This can increase your chance of falling. Ask your doctor what other things that you can do to help prevent falls. This information is not intended to replace advice given to you by your health care provider. Make sure you discuss any questions you have with your health care provider. Document Released: 02/01/2009 Document Revised: 09/13/2015 Document Reviewed: 05/12/2014 Elsevier Interactive Patient Education  2017 [employer].

## 2021-06-07 NOTE — Progress Notes (Signed)
Subjective:   NEIMAN ROOTS II is a 81 y.o. male who presents for Medicare Annual/Subsequent preventive examination.  Virtual Visit via Telephone Note  I connected with  KOOPER GODSHALL II on 06/07/21 at  8:15 AM EST by telephone and verified that I am speaking with the correct person using two identifiers.  Location: Patient: Home Provider: [facility] Persons participating in the virtual visit: patient/Nurse Health Advisor   I discussed the limitations, risks, security and privacy concerns of performing an evaluation and management service by telephone and the availability of in person appointments. The patient expressed understanding and agreed to proceed.  Interactive audio and video telecommunications were attempted between this nurse and patient, however failed, due to patient having technical difficulties OR patient did not have access to video capability.  We continued and completed visit with audio only.  Some vital signs may be absent or patient reported.   Jamala Kohen E Markeeta Scalf, LPN   Review of Systems     Cardiac Risk Factors include: advanced age (>64men, >41 women);male gender;dyslipidemia     Objective:    Today's Vitals   06/07/21 0818  Weight: 193 lb (87.5 kg)   Body mass index is 27.69 kg/m.  Advanced Directives 06/07/2021 06/06/2020 11/26/2017  Does Patient Have a Medical Advance Directive? Yes Yes Yes  Type of Paramedic of [place];Living will [place];Living will Living will  Does patient want to make changes to medical advance directive? - No - Patient declined No - Patient declined  Copy of [place] in Chart? No - copy requested No - copy requested -    Current Medications (verified) Outpatient Encounter Medications as of 06/07/2021  Medication Sig   amLODipine (NORVASC) 5 MG tablet Take 1 tablet (5 mg total) by mouth daily.   atorvastatin (LIPITOR) 40 MG tablet Take 1 tablet (40 mg total) by mouth  daily at 6 PM.   Cyanocobalamin (VITAMIN B 12 PO) Take by mouth.   fluticasone (FLONASE) 50 MCG/ACT nasal spray SPRAY 2 SPRAYS INTO EACH NOSTRIL EVERY DAY   loratadine (CLARITIN) 10 MG tablet TAKE 1 TABLET BY MOUTH EVERY DAY   Multiple Vitamin (MULTIVITAMIN WITH MINERALS) TABS tablet Take 1 tablet by mouth daily.   No facility-administered encounter medications on file as of 06/07/2021.    Allergies (verified) Patient has no known allergies.   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   Past Surgical History:  Procedure Laterality Date   EYE SURGERY  2010   Cataracts   MELANOMA EXCISION     L upper arm   Family History  Problem Relation Age of Onset   Arthritis Mother    Cancer Father        lung   Social History   Socioeconomic History   Marital status: Significant Other    Spouse name: Not on file   Number of children: 1   Years of education: Not on file   Highest education level: Bachelor's degree (e.g., BA, AB, BS)  Occupational History   Occupation: Retired   Occupation: Self Employed    Comment: [phone]  Tobacco Use   Smoking status: Former    Packs/day: 0.50    Years: 10.00    Pack years: 5.00  Types: Cigarettes    Quit date: 12/10/1980    Years since quitting: 40.5   Smokeless tobacco: Never  Vaping Use   Vaping Use: Never used  Substance and Sexual Activity   Alcohol use: Yes    Alcohol/week: 2.0 - 3.0 standard drinks    Types: 2 - 3 Glasses of wine per week   Drug use: No   Sexual activity: Not Currently  Other Topics Concern   Not on file  Social History Narrative   Lives alone   Social Determinants of Health   Financial Resource Strain: Low Risk    Difficulty of Paying Living Expenses: Not hard at all  Food Insecurity: No Food Insecurity   Worried About Sales executive in the Last Year: Never true   Ran Out of Food in the Last Year: Never true  Transportation Needs: No Transportation Needs   Lack of Transportation (Medical): No   Lack of Transportation (Non-Medical): No  Physical Activity: Sufficiently Active   Days of Exercise per Week: 5 days   Minutes of Exercise per Session: 60 min  Stress: No Stress Concern Present   Feeling of Stress : Not at all  Social Connections: Moderately Integrated   Frequency of Communication with Friends and Family: More than three times a week   Frequency of Social Gatherings with Friends and Family: More than three times a week   Attends Religious Services: More than 4 times per year   Active Member of [employer] or Organizations: Yes   Attends Archivist Meetings: More than 4 times per year   Marital Status: Widowed    Tobacco Counseling Counseling given: Not Answered   Clinical Intake:  Pre-visit preparation completed: Yes  Pain : No/denies pain     BMI - recorded: 27.69 Nutritional Status: BMI 25 -29 Overweight Nutritional Risks: None Diabetes: No  How often do you need to have someone help you when you read instructions, pamphlets, or other written materials from your doctor or pharmacy?: 1 - Never  Diabetic? no  Interpreter Needed?: No  Information entered by :: Jasper Hanf, LPN   Activities of Daily Living In your present state of health, do you have any difficulty performing the following activities: 06/07/2021  Hearing? N  Vision? N  Difficulty concentrating or making decisions? N  Walking or climbing stairs? N  Dressing or bathing? N  Doing errands, shopping? N  Preparing Food and eating ? N  Using the Toilet? N  In the past six months, have you accidently leaked urine? N  Do you have problems with loss of bowel control? N  Managing your Medications? N  Managing your Finances? N  Housekeeping or managing your Housekeeping? N  Some recent data might be hidden     Patient Care Team: Janora Norlander, DO as PCP - General (Family Medicine) Loney Loh, MD (Dermatology) Celestia Khat, OD (Optometry)  Indicate any recent Medical Services you may have received from other than [facility] providers in the past year (date may be approximate).     Assessment:   This is a routine wellness examination for [place].  Hearing/Vision screen Hearing Screening - Comments:: Denies hearing difficulties   Vision Screening - Comments:: Wears rx glasses - up to date with routine eye exams with [facility]  Dietary issues and exercise activities discussed: Current Exercise Habits: Home exercise routine, Type of exercise: treadmill;strength training/weights, Time (Minutes): 60, Frequency (Times/Week): 5, Weekly Exercise (Minutes/Week): 300, Intensity: Intense, Exercise limited by:  None identified   Goals Addressed             This Visit's Progress    Prevent falls   On track    Stay active and healthy Spend time with family        Depression Screen PHQ 2/9 Scores 06/07/2021 02/12/2021 01/18/2021 07/16/2020 06/06/2020 03/01/2020 01/06/2020  PHQ - 2 Score 0 0 0 0 0 0 0  PHQ- 9 Score - 0 0 - - - -    Fall Risk Fall Risk  06/07/2021 01/18/2021 07/16/2020 06/06/2020 03/01/2020  Falls in the past year? 0 0 0 0 0  Number falls in past yr: 0 - - - -  Injury with Fall? 0 - - - -  Risk for fall due to : No Fall Risks - - - -  Follow up Falls prevention discussed - - - -    FALL RISK PREVENTION PERTAINING TO THE HOME:  Any stairs in or around the home? Yes  If so, are there any without handrails? No  Home free of loose throw rugs in walkways, pet beds, electrical cords, etc? Yes  Adequate lighting in your home to reduce risk of falls? Yes   ASSISTIVE DEVICES UTILIZED TO PREVENT FALLS:  Life alert? No  Use of a cane, walker or w/c? No  Grab bars in the bathroom? Yes  Shower chair or bench in shower? Yes  Elevated toilet seat or a handicapped toilet?  Yes   TIMED UP AND GO:  Was the test performed? No . Telephonic visit  Cognitive Function: Normal cognitive status assessed by direct observation by this Nurse Health Advisor. No abnormalities found.   MMSE - Mini Mental State Exam 03/27/2020 03/01/2020 11/25/2019 11/26/2017  Orientation to time 5 5 4 5   Orientation to Place 5 5 5 5   Registration 3 3 3 3   Attention/ Calculation 4 5 5 5   Recall 2 3 0 3  Language- name 2 objects 2 2 2 2   Language- repeat 1 1 1 1   Language- follow 3 step command 3 3 3 3   Language- read & follow direction 1 1 1 1   Write a sentence 1 1 1 1   Copy design 1 1 1 1   Total score 28 30 26 30      6CIT Screen 06/06/2020  What Year? 0 points  What month? 0 points  What time? 0 points  Count back from 20 0 points  Months in reverse 0 points  Repeat phrase 0 points  Total Score 0    Immunizations Immunization History  Administered Date(s) Administered   Influenza, High Dose Seasonal PF 01/13/2015, 12/20/2015, 01/06/2017, 12/20/2019, 01/11/2021   Influenza-Unspecified 01/06/2017, 12/03/2017, 12/09/2018   Moderna Covid-19 Vaccine Bivalent Booster 46yrs & up 01/22/2021   Moderna Sars-Covid-2 Vaccination 05/03/2019, 06/03/2019, 02/15/2020, 10/30/2020   Pneumococcal Conjugate-13 01/23/2015   Pneumococcal Polysaccharide-23 06/14/2007   Td 01/28/2017   Zoster Recombinat (Shingrix) 06/05/2020, 08/06/2020    TDAP status: Up to date  Flu Vaccine status: Up to date  Pneumococcal vaccine status: Up to date  Covid-19 vaccine status: Completed vaccines  Qualifies for Shingles Vaccine? Yes   Zostavax completed Yes   Shingrix Completed?: Yes  Screening Tests Health Maintenance  Topic Date Due   TETANUS/TDAP  01/29/2027   Pneumonia Vaccine 30+ Years old  Completed   INFLUENZA VACCINE  Completed   COVID-19 Vaccine  Completed   Zoster Vaccines- Shingrix  Completed   HPV VACCINES  Aged Out    Health  Maintenance  There are no preventive care reminders to  display for this patient.  Colorectal cancer screening: No longer required.   Lung Cancer Screening: (Low Dose CT Chest recommended if Age 58-80 years, 30 pack-year currently smoking OR have quit w/in 15years.) does not qualify.   Additional Screening:  Hepatitis C Screening: does qualify  Vision Screening: Recommended annual ophthalmology exams for early detection of glaucoma and other disorders of the eye. Is the patient up to date with their annual eye exam?  Yes  Who is the provider or what is the name of the office in which the patient attends annual eye exams? [place] If pt is not established with a provider, would they like to be referred to a provider to establish care? No .   Dental Screening: Recommended annual dental exams for proper oral hygiene  Community Resource Referral / Chronic Care Management: CRR required this visit?  No   CCM required this visit?  No      Plan:     I have personally reviewed and noted the following in the patients chart:   Medical and social history Use of alcohol, tobacco or illicit drugs  Current medications and supplements including opioid prescriptions. Patient is not currently taking opioid prescriptions. Functional ability and status Nutritional status Physical activity Advanced directives List of other physicians Hospitalizations, surgeries, and ER visits in previous 12 months Vitals Screenings to include cognitive, depression, and falls Referrals and appointments  In addition, I have reviewed and discussed with patient certain preventive protocols, quality metrics, and best practice recommendations. A written personalized care plan for preventive services as well as general preventive health recommendations were provided to patient.     Sandrea Hammond, LPN   [id number]   Nurse Notes: None

## 2021-07-10 ENCOUNTER — Ambulatory Visit (INDEPENDENT_AMBULATORY_CARE_PROVIDER_SITE_OTHER): Payer: Medicare Other | Admitting: Family Medicine

## 2021-07-10 ENCOUNTER — Encounter: Payer: Self-pay | Admitting: Family Medicine

## 2021-07-10 VITALS — BP 124/67 | HR 69 | Temp 98.0°F | Ht 70.0 in | Wt 195.0 lb

## 2021-07-10 DIAGNOSIS — M7582 Other shoulder lesions, left shoulder: Secondary | ICD-10-CM | POA: Diagnosis not present

## 2021-07-10 DIAGNOSIS — Z7185 Encounter for immunization safety counseling: Secondary | ICD-10-CM

## 2021-07-10 DIAGNOSIS — I10 Essential (primary) hypertension: Secondary | ICD-10-CM | POA: Diagnosis not present

## 2021-07-10 DIAGNOSIS — Z85828 Personal history of other malignant neoplasm of skin: Secondary | ICD-10-CM

## 2021-07-10 NOTE — Progress Notes (Signed)
? ?Subjective: ?CC: Left shoulder pain ?PCP: Janora Norlander, DO ?RCV:David Guerrero is a 81 y.o. male presenting to clinic today for: ? ?1.  Left shoulder pain ?Patient reports that he has had several week history of left-sided shoulder pain.  It seemed to start after he started weightlifting.  He is has stopped weightlifting for about 3 weeks and it is getting better but remains sore.  Denies any weakness in the upper extremity nor any sensory changes.  Has not used any medications to treat yet ? ?2.  Skin lesion ?Patient reports that he is worried about a scar/postsurgical site where he had a skin lesion removed by dermatology recently.  He thought it looked quite deep and just wanted me to take a look at it.  No drainage reported ? ?3.  COVID vaccination ?Patient is up-to-date on COVID vaccination except for the very last one he wants to know whether or not this is something he should pursue.  Does not report any significant intolerance to previous vaccination. ? ? ?ROS: Per HPI ? ?No Known Allergies ?Past Medical History:  ?Diagnosis Date  ? Cancer [facility])   ? melanoma, removed apprx 2013, early stage per pt  ? Cataract 12/14/2008  ? Hyperlipidemia   ? Memory deficit   ? 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 ? ?Current Outpatient Medications:  ?  amLODipine (NORVASC) 5 MG tablet, Take 1 tablet (5 mg total) by mouth daily., Disp: 90 tablet, Rfl: 3 ?  atorvastatin (LIPITOR) 40 MG tablet, Take 1 tablet (40 mg total) by mouth daily at 6 PM., Disp: 90 tablet, Rfl: 3 ?  Cyanocobalamin (VITAMIN B 12 PO), Take by mouth., Disp: , Rfl:  ?  fluticasone (FLONASE) 50 MCG/ACT nasal spray, SPRAY 2 SPRAYS INTO EACH NOSTRIL EVERY DAY, Disp: 48 mL, Rfl: 2 ?  loratadine (CLARITIN) 10 MG tablet, TAKE 1 TABLET BY MOUTH EVERY DAY, Disp: 90 tablet, Rfl: 3 ?  Multiple Vitamin (MULTIVITAMIN WITH MINERALS) TABS tablet, Take 1 tablet by mouth daily., Disp: , Rfl:  ?Social History  ? ?Socioeconomic History  ? Marital status: Significant  Other  ?  Spouse name: Not on file  ? Number of children: 1  ? Years of education: Not on file  ? Highest education level: Bachelor's degree (e.g., BA, AB, BS)  ?Occupational History  ? Occupation: Retired  ? Occupation: Self Employed  ?  Comment: [phone]  ?Tobacco Use  ? Smoking status: Former  ?  Packs/day: 0.50  ?  Years: 10.00  ?  Pack years: 5.00  ?  Types: Cigarettes  ?  Quit date: 12/10/1980  ?  Years since quitting: 40.6  ? Smokeless tobacco: Never  ?Vaping Use  ? Vaping Use: Never used  ?Substance and Sexual Activity  ? Alcohol use: Yes  ?  Alcohol/week: 2.0 - 3.0 standard drinks  ?  Types: 2 - 3 Glasses of wine per week  ? Drug use: No  ? Sexual activity: Not Currently  ?Other Topics Concern  ? Not on file  ?Social History Narrative  ? Lives alone  ? ?Social Determinants of Health  ? ?Financial Resource Strain: Low Risk   ? Difficulty of Paying Living Expenses: Not hard at all  ?Food Insecurity: No Food Insecurity  ? Worried About Charity fundraiser in the Last Year: Never true  ? Ran Out of Food in the Last Year: Never true  ?Transportation Needs: No Transportation Needs  ? Lack of Transportation (  Medical): No  ? Lack of Transportation (Non-Medical): No  ?Physical Activity: Sufficiently Active  ? Days of Exercise per Week: 5 days  ? Minutes of Exercise per Session: 60 min  ?Stress: No Stress Concern Present  ? Feeling of Stress : Not at all  ?Social Connections: Moderately Integrated  ? Frequency of Communication with Friends and Family: More than three times a week  ? Frequency of Social Gatherings with Friends and Family: More than three times a week  ? Attends Religious Services: More than 4 times per year  ? Active Member of Clubs or Organizations: Yes  ? Attends Archivist Meetings: More than 4 times per year  ? Marital Status: Widowed  ?Intimate Partner Violence: Not At Risk  ? Fear of Current or Ex-Partner: No  ? Emotionally Abused: No  ? Physically Abused: No  ? Sexually Abused: No   ? ?Family History  ?Problem Relation Age of Onset  ? Arthritis Mother   ? Cancer Father   ?     lung  ? ? ?Objective: ?Office vital signs reviewed. ?BP 124/67   Pulse 69   Temp 98 ?F (36.7 ?C)   Ht '5\' 10"'$  (1.778 m)   Wt 195 lb (88.5 kg)   SpO2 95%   BMI 27.98 kg/m?  ? ?Physical Examination:  ?General: Awake, alert, well nourished, No acute distress ?Cardio : Regular rate and rhythm  ?Pulmonary: Normal work of breathing on room air ?MSK:  ? Left shoulder: No gross deformity noted on visual inspection.  No joint swelling or warmth.  Has preserved flexion and mildly reduced internal rotation of the shoulder.  Cannot ABduct left upper extremity beyond 90 degrees.  Painful arc present.  Positive empty can.  Some weakness with resisted muscle testing in the left upper extremity as well ?Skin: dry; intact; has an elliptical postsurgical scar noted along the left mid upper back. ?Neuro: Light touch sensation grossly intact ? ?Assessment/ Plan: ?81 y.o. male  ? ?Rotator cuff tendinitis, left ? ?Personal history of other malignant neoplasm of skin ? ?Vaccine counseling ? ?Essential hypertension ? ?I suspect that his supraspinatus is affected.  I worry about him developing frozen shoulder.  I have given him a handout with home physical therapy exercises today and we demonstrated a couple of them today.  If he is not having any significant improvement in symptoms over the next few weeks I would like him to see orthopedics for further evaluation.  Advised to avoid weight lifting but may continue other physical activities.  Could consider water exercise.  Encouraged him to continue exercising regularly as he has been. ? ?The postsurgical site on his back did not show any evidence of infection or other complications.  It looked to be well-healed ? ?Counseled him on COVID vaccination today.  This should be his very last 1. ? ?Blood pressure well controlled.  No need for changes at this time. ? ?No orders of the defined types  were placed in this encounter. ? ?No orders of the defined types were placed in this encounter. ? ?May follow-up in 6 months for annual physical with fasting labs ? ?Janora Norlander, DO ?[place] ?[phone] ? ? ?

## 2021-07-10 NOTE — Patient Instructions (Signed)
Rotator Cuff Tendinitis ?Rotator cuff tendinitis is inflammation of the tendons in the rotator cuff. Tendons are tough, cord-like bands that connect muscle to bone. The rotator cuff includes all of the muscles and tendons that connect the arm to the shoulder. The rotator cuff holds the head of the humerus, or the upper arm bone, in the cup of the shoulder blade (scapula). ?This condition can lead to a long-term or chronic tear. The tear may be partial or complete. ?What are the causes? ?This condition is usually caused by overusing the rotator cuff. ?What increases the risk? ?This condition is more likely to develop in athletes and workers who frequently use their shoulder or reach over their heads. This can include activities such as: ?Tennis. ?Baseball or softball. ?Swimming. ?Architect work. ?Painting. ?What are the signs or symptoms? ?Symptoms of this condition include: ?Pain that spreads (radiates) from the shoulder to the upper arm. ?Swelling and tenderness in front of the shoulder. ?Pain when reaching, pulling, or lifting the arm above the head. ?Pain when lowering the arm from above the head. ?Minor pain in the shoulder when resting. ?Increased pain in the shoulder at night. ?Difficulty placing the arm behind the back. ?How is this diagnosed? ?This condition is diagnosed with a physical exam and medical history. Tests may also be done, including: ?X-rays. ?MRI. ?Ultrasound. ?CT with or without contrast. ?How is this treated? ?Treatment for this condition depends on the severity of the condition. In less severe cases, treatment may include: ?Rest. This may be done with a sling that holds the shoulder still (immobilization). Your health care provider may also recommend avoiding activities that involve lifting your arm over your head. ?Icing the shoulder. ?Anti-inflammatory medicines, such as aspirin or ibuprofen. ?In more severe cases, treatment may include: ?Physical therapy. ?Steroid  injections. ?Surgery. ?Follow these instructions at home: ?If you have a sling: ?Wear the sling as told by your health care provider. Remove it only as told by your health care provider. ?Loosen it if your fingers tingle, become numb, or turn cold and blue. ?Keep it clean. ?If the sling is not waterproof: ?Do not let it get wet. ?Cover it with a watertight covering when you take a bath or shower. ?Managing pain, stiffness, and swelling ? ?If directed, put ice on the injured area. To do this: ?If you have a removable sling, remove it as told by your health care provider. ?Put ice in a plastic bag. ?Place a towel between your skin and the bag. ?Leave the ice on for 20 minutes, 2-3 times a day. ?Move your fingers often to reduce stiffness and swelling. ?Raise (elevate) the injured area above the level of your heart while you are lying down. ?Find a comfortable sleeping position, or sleep in a recliner, if available. ?Activity ?Rest your shoulder as told by your health care provider. ?Ask your health care provider when it is safe to drive if you have a sling on your arm. ?Return to your normal activities as told by your health care provider. Ask your health care provider what activities are safe for you. ?Do any exercises or stretches as told by your health care provider or physical therapist. ?If you do repetitive overhead tasks, take small breaks in between and include stretching exercises as told by your health care provider. ?General instructions ?Do not use any products that contain nicotine or tobacco, such as cigarettes, e-cigarettes, and chewing tobacco. These can delay healing. If you need help quitting, ask your health care provider. ?Take  over-the-counter and prescription medicines only as told by your health care provider. ?Keep all follow-up visits as told by your health care provider. This is important. ?Contact a health care provider if: ?Your pain gets worse. ?You have new pain in your arm, hands, or  fingers. ?Your pain is not relieved with medicine or does not get better after 6 weeks of treatment. ?You have crackling sensations when moving your shoulder in certain directions. ?You hear a snapping sound after using your shoulder, followed by severe pain and weakness. ?Get help right away if: ?Your arm, hand, or fingers are numb or tingling. ?Your arm, hand, or fingers are swollen or painful or they turn white or blue. ?Summary ?Rotator cuff tendinitis is inflammation of the tendons in the rotator cuff. Tendons are tough, cord-like bands that connect muscle to bone. ?This condition is usually caused by overusing the rotator cuff, which includes all of the muscles and tendons that connect the arm to the shoulder. ?This condition is more likely to develop in athletes and workers who frequently use their shoulder or reach over their heads. ?Treatment generally includes rest, anti-inflammatory medicines, and icing. In some cases, physical therapy and steroid injections may be needed. In severe cases, surgery may be needed. ?This information is not intended to replace advice given to you by your health care provider. Make sure you discuss any questions you have with your health care provider. ?Document Revised: 01/10/2019 Document Reviewed: 01/10/2019 ?Elsevier Patient Education ? [place]. ? ?

## 2021-07-17 ENCOUNTER — Other Ambulatory Visit: Payer: Self-pay | Admitting: Family Medicine

## 2021-07-17 DIAGNOSIS — J301 Allergic rhinitis due to pollen: Secondary | ICD-10-CM

## 2021-07-18 DIAGNOSIS — U071 COVID-19: Secondary | ICD-10-CM | POA: Diagnosis not present

## 2021-07-25 DIAGNOSIS — L821 Other seborrheic keratosis: Secondary | ICD-10-CM | POA: Diagnosis not present

## 2021-07-25 DIAGNOSIS — D229 Melanocytic nevi, unspecified: Secondary | ICD-10-CM | POA: Diagnosis not present

## 2021-07-25 DIAGNOSIS — Z85828 Personal history of other malignant neoplasm of skin: Secondary | ICD-10-CM | POA: Diagnosis not present

## 2021-07-25 DIAGNOSIS — Z86006 Personal history of melanoma in-situ: Secondary | ICD-10-CM | POA: Diagnosis not present

## 2021-08-14 DIAGNOSIS — Z20822 Contact with and (suspected) exposure to covid-19: Secondary | ICD-10-CM | POA: Diagnosis not present

## 2021-08-21 ENCOUNTER — Ambulatory Visit (INDEPENDENT_AMBULATORY_CARE_PROVIDER_SITE_OTHER): Payer: Medicare Other | Admitting: Family Medicine

## 2021-08-21 ENCOUNTER — Ambulatory Visit (INDEPENDENT_AMBULATORY_CARE_PROVIDER_SITE_OTHER): Payer: Medicare Other

## 2021-08-21 ENCOUNTER — Encounter: Payer: Self-pay | Admitting: Family Medicine

## 2021-08-21 VITALS — BP 136/64 | HR 65 | Temp 97.4°F | Ht 70.0 in | Wt 194.2 lb

## 2021-08-21 DIAGNOSIS — Z043 Encounter for examination and observation following other accident: Secondary | ICD-10-CM | POA: Diagnosis not present

## 2021-08-21 DIAGNOSIS — T148XXA Other injury of unspecified body region, initial encounter: Secondary | ICD-10-CM

## 2021-08-21 DIAGNOSIS — S80211A Abrasion, right knee, initial encounter: Secondary | ICD-10-CM | POA: Diagnosis not present

## 2021-08-21 DIAGNOSIS — S8011XA Contusion of right lower leg, initial encounter: Secondary | ICD-10-CM | POA: Diagnosis not present

## 2021-08-21 DIAGNOSIS — M79661 Pain in right lower leg: Secondary | ICD-10-CM | POA: Diagnosis not present

## 2021-08-21 DIAGNOSIS — W19XXXA Unspecified fall, initial encounter: Secondary | ICD-10-CM

## 2021-08-21 DIAGNOSIS — M79604 Pain in right leg: Secondary | ICD-10-CM | POA: Diagnosis not present

## 2021-08-21 NOTE — Progress Notes (Signed)
? ?Assessment & Plan:  ?1. Abrasion of right knee, initial encounter ?Wound cleansed with normal saline, patted dry, and covered with a Band-Aide. ? ?2. Hematoma of right lower leg ?Encouraged to apply ice. ? ?3. Right leg pain ?Tylenol for pain.  ? ?4. Fall, initial encounter ?- DG Tibia/Fibula Right; Future ? ? ?Follow up plan: Return if symptoms worsen or fail to improve. ? ?Hendricks Limes, MSN, APRN, FNP-C ?[place] ? ?Subjective:  ? ?Patient ID: David Guerrero, male    DOB: 1941-03-19, 81 y.o.   MRN: [id number] ? ?HPI: ?David Guerrero is a 81 y.o. male presenting on 08/21/2021 for Fall (Patient states he slipped in the bathroom today at 12:55pm and now has right knee pain and right lower leg pain. ) ? ?Patient slipped and fell in the bathroom about an hour ago.  He is having pain in his right knee and right lower leg. He did sustain an abrasion to his right knee.  ? ? ?ROS: Negative unless specifically indicated above in HPI.  ? ?Relevant past medical history reviewed and updated as indicated.  ? ?Allergies and medications reviewed and updated. ? ? ?Current Outpatient Medications:  ?  amLODipine (NORVASC) 5 MG tablet, Take 1 tablet (5 mg total) by mouth daily., Disp: 90 tablet, Rfl: 3 ?  atorvastatin (LIPITOR) 40 MG tablet, Take 1 tablet (40 mg total) by mouth daily at 6 PM., Disp: 90 tablet, Rfl: 3 ?  Cyanocobalamin (VITAMIN B 12 PO), Take by mouth., Disp: , Rfl:  ?  fluticasone (FLONASE) 50 MCG/ACT nasal spray, SPRAY 2 SPRAYS INTO EACH NOSTRIL EVERY DAY, Disp: 48 mL, Rfl: 1 ?  loratadine (CLARITIN) 10 MG tablet, TAKE 1 TABLET BY MOUTH EVERY DAY, Disp: 90 tablet, Rfl: 3 ?  Multiple Vitamin (MULTIVITAMIN WITH MINERALS) TABS tablet, Take 1 tablet by mouth daily., Disp: , Rfl:  ? ?No Known Allergies ? ?Objective:  ? ?BP (!) 145/68   Pulse 65   Temp (!) 97.4 ?F (36.3 ?C) (Temporal)   Ht '5\' 10"'$  (1.778 m)   Wt 194 lb 3.2 oz (88.1 kg)   BMI 27.86 kg/m?   ? ?Physical Exam ?Vitals  reviewed.  ?Constitutional:   ?   General: He is not in acute distress. ?   Appearance: Normal appearance. He is not ill-appearing, toxic-appearing or diaphoretic.  ?HENT:  ?   Head: Normocephalic and atraumatic.  ?Eyes:  ?   General: No scleral icterus.    ?   Right eye: No discharge.     ?   Left eye: No discharge.  ?   Conjunctiva/sclera: Conjunctivae normal.  ?Cardiovascular:  ?   Rate and Rhythm: Normal rate.  ?Pulmonary:  ?   Effort: Pulmonary effort is normal. No respiratory distress.  ?Musculoskeletal:     ?   General: Normal range of motion.  ?   Cervical back: Normal range of motion.  ?Skin: ?   General: Skin is warm and dry.  ?   Findings: Abrasion (right knee) present.  ?   Comments: Hematoma to right shin.  ?Neurological:  ?   Mental Status: He is alert and oriented to person, place, and time. Mental status is at baseline.  ?Psychiatric:     ?   Mood and Affect: Mood normal.     ?   Behavior: Behavior normal.     ?   Thought Content: Thought content normal.     ?   Judgment: Judgment normal.  ? ? ? ? ? ? ?

## 2021-08-26 DIAGNOSIS — Z20822 Contact with and (suspected) exposure to covid-19: Secondary | ICD-10-CM | POA: Diagnosis not present

## 2021-08-28 ENCOUNTER — Encounter: Payer: Self-pay | Admitting: Family

## 2021-08-28 ENCOUNTER — Ambulatory Visit (INDEPENDENT_AMBULATORY_CARE_PROVIDER_SITE_OTHER): Payer: Medicare Other | Admitting: Family

## 2021-08-28 DIAGNOSIS — S8011XA Contusion of right lower leg, initial encounter: Secondary | ICD-10-CM | POA: Diagnosis not present

## 2021-08-31 NOTE — Progress Notes (Signed)
? ?Office Visit Note ?  ?Patient: David Guerrero           ?Date of Birth: 03-23-1941           ?MRN: [id number] ?Visit Date: 08/28/2021 ?             ?Requested by: Loman Brooklyn, FNP ?[address] [place],  [place] [address] ?PCP: Janora Norlander, DO ? ?Chief Complaint  ?Patient presents with  ? Right Leg - Pain  ?  DOI 08/21/2021  ? ? ? ? ?HPI: ?The patient is an 81 year old gentleman who presents today for initial evaluation of left lower extremity painful a fall he was getting out of the shower slipped on wet tile and fell in his bathroom he had direct impact on his shin.  His anterior shin is most painful.  He has also had some bruising of the shin primarily some swelling in his calf.  He is full weightbearing in regular shoewear has no issues with weightbearing no pain with weightbearing ? ?Was sent for evaluation for some abnormal radiographic findings.  The radiographs performed at the emergency department showed concern for subtle fracture of the lateral tibial spine however there was no joint effusion.  He is not having any knee pain today ? ?Assessment & Plan: ?Visit Diagnoses: No diagnosis found. ? ?Plan: Reassurance provided.  There is no acute fracture.  Spoke with family over the phone as well.  He will advance his activities as tolerated.  Can certainly follow-up if things fail to improve.  Discussed using a warm compress for his hematoma over his shin. ? ?Follow-Up Instructions: Return if symptoms worsen or fail to improve.  ? ?Right Knee Exam  ? ?Muscle Strength  ?The patient has normal right knee strength. ? ?Tenderness  ?The patient is experiencing no tenderness.  ? ?Range of Motion  ?The patient has normal right knee ROM. ? ?Tests  ?Varus: negative Valgus: negative ? ?Other  ?Swelling: none ?Effusion: no effusion present ? ? ? ? ?Patient is alert, oriented, no adenopathy, well-dressed, normal affect, normal respiratory effort. ?On examination of the right lower extremity he does have 3 cm  in diameter hematoma over the anterior shin there is some old ecchymosis.  Mild erythema there is no sign of cellulitis no warmth no fluctuance no calf tenderness ? ?Imaging: ?No results found. ?No images are attached to the encounter. ? ?Labs: ?No results found for: HGBA1C, ESRSEDRATE, CRP, LABURIC, REPTSTATUS, GRAMSTAIN, CULT, LABORGA ? ? ?Lab Results  ?Component Value Date  ? ALBUMIN 4.5 07/16/2020  ? ALBUMIN 4.2 11/28/2019  ? ALBUMIN 3.9 11/26/2017  ? ? ?No results found for: MG ?No results found for: VD25OH ? ?No results found for: PREALBUMIN ? ?  Latest Ref Rng & Units 07/16/2020  ?  2:42 PM 11/28/2019  ?  8:05 AM 10/19/2008  ? 10:22 AM  ?CBC EXTENDED  ?WBC 3.4 - 10.8 x10E3/uL 6.6   3.8     ?RBC 4.14 - 5.80 x10E6/uL 4.90   4.78     ?Hemoglobin 13.0 - 17.7 g/dL 16.1   16.0   15.6    ?HCT 37.5 - 51.0 % 45.6   45.4   43.3    ?Platelets 150 - 450 x10E3/uL 167   165     ?NEUT# 1.4 - 7.0 x10E3/uL  2.0     ?Lymph# 0.7 - 3.1 x10E3/uL  1.3     ? ? ? ?There is no height or weight on file  to calculate BMI. ? ?Orders:  ?No orders of the defined types were placed in this encounter. ? ?No orders of the defined types were placed in this encounter. ? ? ? Procedures: ?No procedures performed ? ?Clinical Data: ?No additional findings. ? ?ROS: ? ?All other systems negative, except as noted in the HPI. ?Review of Systems ? ?Objective: ?Vital Signs: There were no vitals taken for this visit. ? ?Specialty Comments:  ?No specialty comments available. ? ?PMFS History: ?Patient Active Problem List  ? Diagnosis Date Noted  ? Dysplastic nevus 10/26/2020  ? Inflamed seborrheic keratosis 01/26/2019  ? Dysplastic nevus of trunk 05/03/2018  ? Neoplasm of uncertain behavior of skin 05/04/2017  ? Overweight (BMI 25.0-29.9) 12/18/2015  ? Hyperlipidemia 12/15/2014  ? Multiple benign nevi 10/30/2014  ? Actinic keratosis 10/14/2012  ? Personal history of other malignant neoplasm of skin 10/14/2012  ? ?Past Medical History:  ?Diagnosis Date  ? Cancer  [facility])   ? melanoma, removed apprx 2013, early stage per pt  ? Cataract 12/14/2008  ? Hyperlipidemia   ? Memory deficit   ? 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  ?Family History  ?Problem Relation Age of Onset  ? Arthritis Mother   ? Cancer Father   ?     lung  ?  ?Past Surgical History:  ?Procedure Laterality Date  ? EYE SURGERY  2010  ? Cataracts  ? MELANOMA EXCISION    ? L upper arm  ? ?Social History  ? ?Occupational History  ? Occupation: Retired  ? Occupation: Self Employed  ?  Comment: [phone]  ?Tobacco Use  ? Smoking status: Former  ?  Packs/day: 0.50  ?  Years: 10.00  ?  Pack years: 5.00  ?  Types: Cigarettes  ?  Quit date: 12/10/1980  ?  Years since quitting: 40.7  ? Smokeless tobacco: Never  ?Vaping Use  ? Vaping Use: Never used  ?Substance and Sexual Activity  ? Alcohol use: Yes  ?  Alcohol/week: 2.0 - 3.0 standard drinks  ?  Types: 2 - 3 Glasses of wine per week  ? Drug use: No  ? Sexual activity: Not Currently  ? ? ? ? ? ?

## 2021-09-04 DIAGNOSIS — H52223 Regular astigmatism, bilateral: Secondary | ICD-10-CM | POA: Diagnosis not present

## 2021-09-04 DIAGNOSIS — H43812 Vitreous degeneration, left eye: Secondary | ICD-10-CM | POA: Diagnosis not present

## 2021-09-04 DIAGNOSIS — H524 Presbyopia: Secondary | ICD-10-CM | POA: Diagnosis not present

## 2021-09-04 DIAGNOSIS — Z961 Presence of intraocular lens: Secondary | ICD-10-CM | POA: Diagnosis not present

## 2021-09-04 DIAGNOSIS — D3131 Benign neoplasm of right choroid: Secondary | ICD-10-CM | POA: Diagnosis not present

## 2021-09-04 DIAGNOSIS — H26491 Other secondary cataract, right eye: Secondary | ICD-10-CM | POA: Diagnosis not present

## 2021-11-15 IMAGING — MR MR HEAD W/O CM
10 series · 48 of 48 positions shown · non-contrast
Comparison: None.

CLINICAL DATA: Memory loss

EXAM:
MRI HEAD WITHOUT CONTRAST
TECHNIQUE: Multiplanar, multiecho pulse sequences of the brain and surrounding
structures were obtained without intravenous contrast.

[Series 2: t1_se_sag · sagittal · 5.0mm · 0.45mm/px · 3 of 23 slices shown]
[im 1/23]
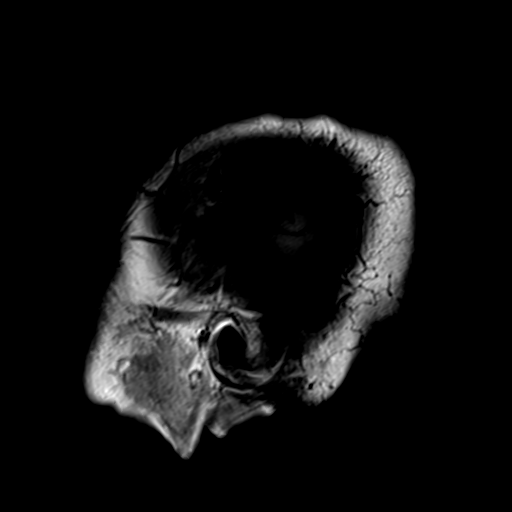
[im 12/23]
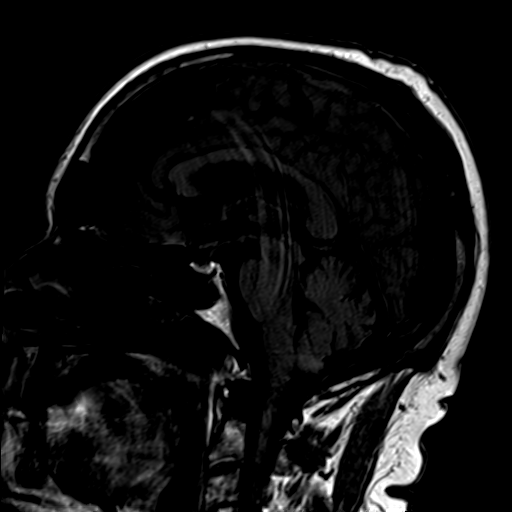
[im 23/23]
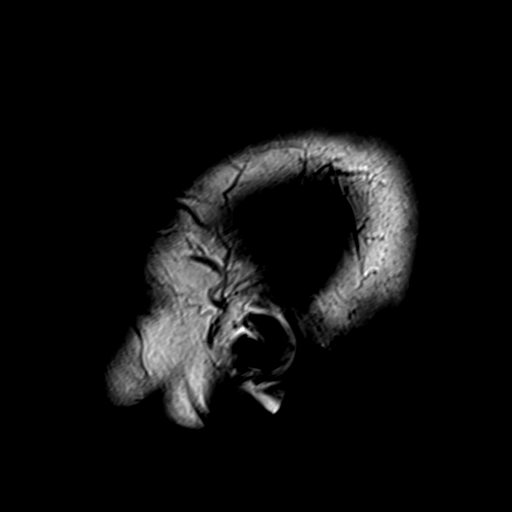

[Series 3: ep2d_diff_3 · axial · 3.0mm · 1.80mm/px · z∈[-20,+114]mm · 9 of 95 slices shown]
[im 1/95]
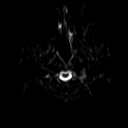
[im 12/95]
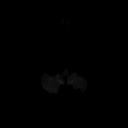
[im 24/95]
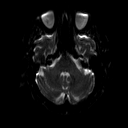
[im 36/95]
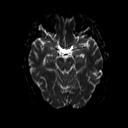
[im 48/95]
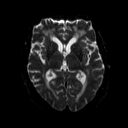
[im 59/95]
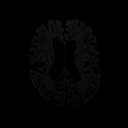
[im 71/95]
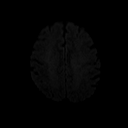
[im 83/95]
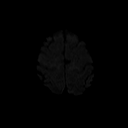
[im 95/95]
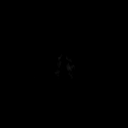

[Series 4: ep2d_diff_3_adc · axial · 3.0mm · 1.80mm/px · z∈[-20,+114]mm · 4 of 48 slices shown]
[im 1/48]
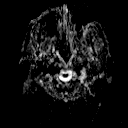
[im 16/48]
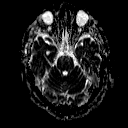
[im 32/48]
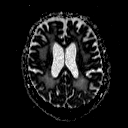
[im 48/48]
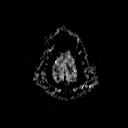

[Series 5: ep2d_diff_cor · coronal · 5.0mm · 1.77mm/px · 5 of 54 slices shown]
[im 1/54]
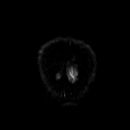
[im 14/54]
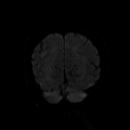
[im 27/54]
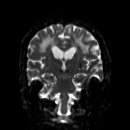
[im 40/54]
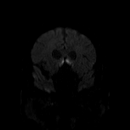
[im 54/54]
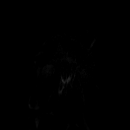

[Series 6: ep2d_diff_cor_adc · coronal · 5.0mm · 1.77mm/px · 2 of 27 slices shown]
[im 1/27]
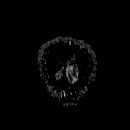
[im 27/27]
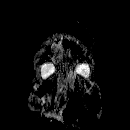

[Series 8: swi_images · axial · 2.0mm · 0.90mm/px · z∈[-20,+115]mm · 6 of 72 slices shown]
[im 1/72]
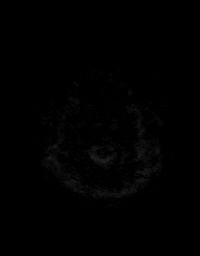
[im 15/72]
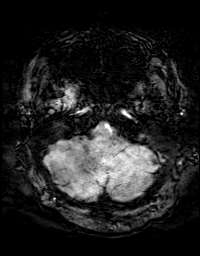
[im 29/72]
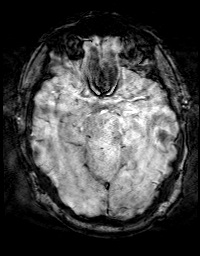
[im 43/72]
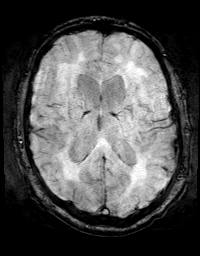
[im 57/72]
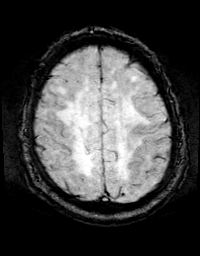
[im 72/72]
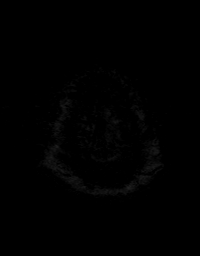

[Series 9: FLAIR · axial · 3.0mm · 0.43mm/px · z∈[-23,+114]mm · 2 of 25 slices shown]
[im 1/25]
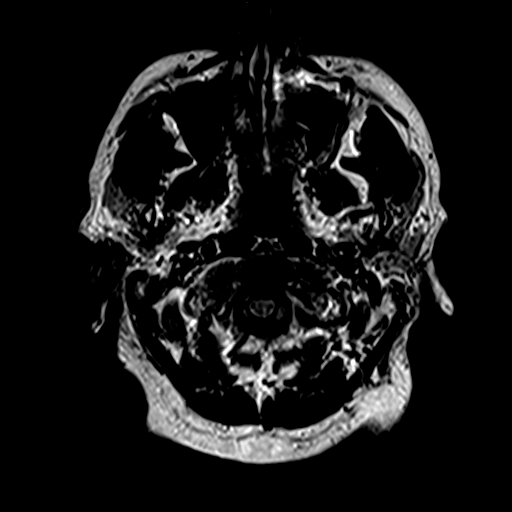
[im 25/25]
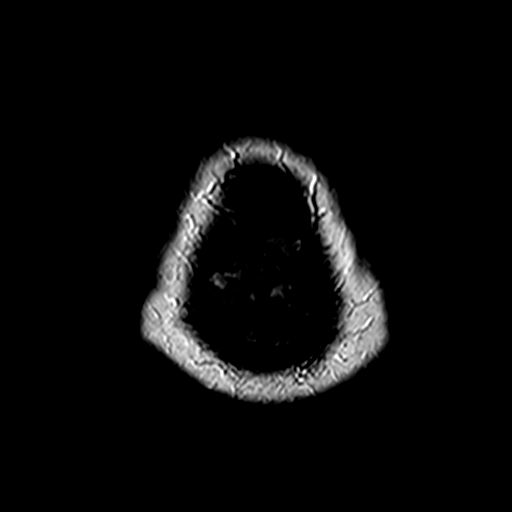

[Series 10: t2_tse_tra_512 · axial · 5.0mm · 0.60mm/px · z∈[-18,+113]mm · 2 of 24 slices shown]
[im 1/24]
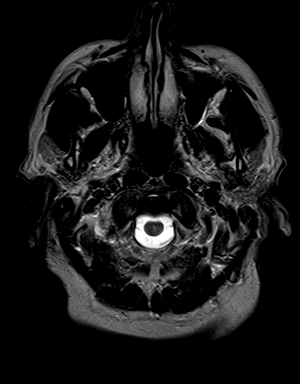
[im 24/24]
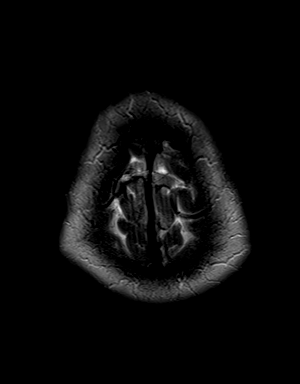

[Series 11: t1_mpr_tra · axial · 1.0mm · 0.72mm/px · z∈[-21,+115]mm · 13 of 144 slices shown]
[im 1/144]
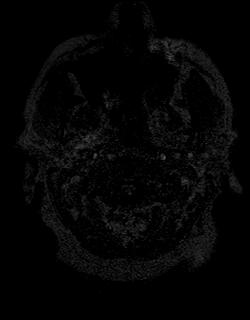
[im 12/144]
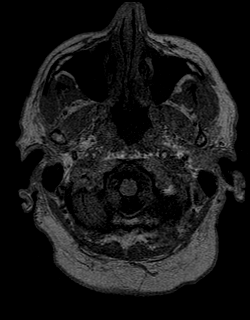
[im 24/144]
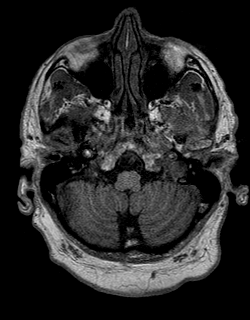
[im 36/144]
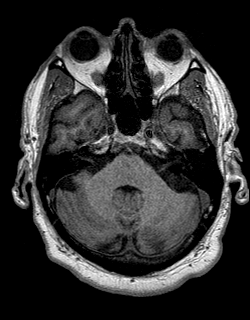
[im 48/144]
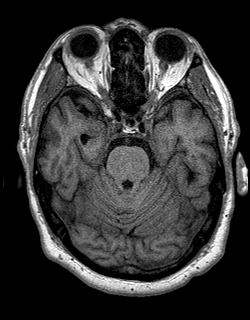
[im 60/144]
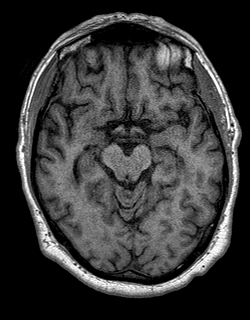
[im 72/144]
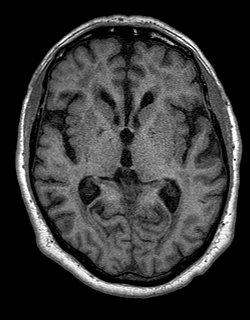
[im 84/144]
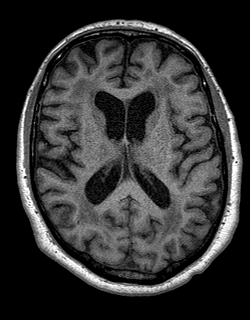
[im 96/144]
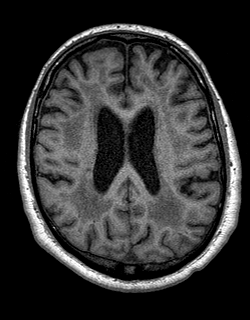
[im 108/144]
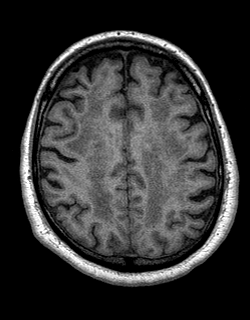
[im 120/144]
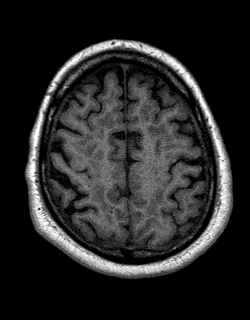
[im 132/144]
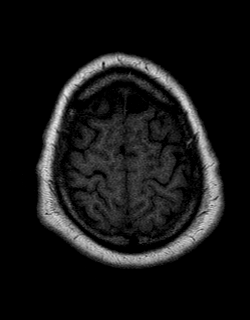
[im 144/144]
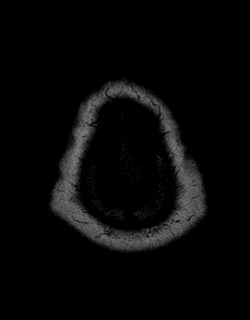

[Series 12: T2 · coronal · 5.0mm · 0.45mm/px · 2 of 28 slices shown]
[im 1/28]
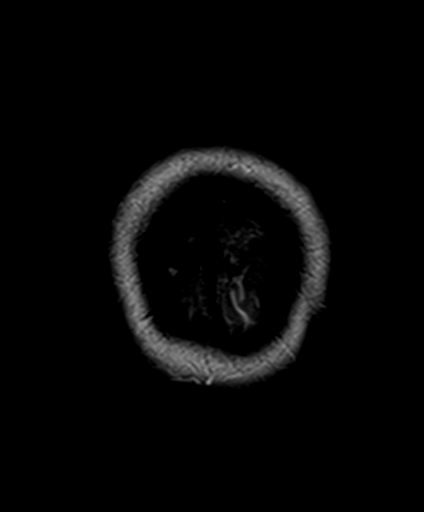
[im 28/28]
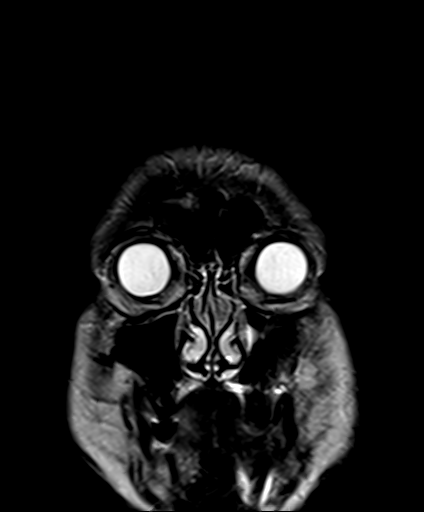

[48 of 48 positions shown; findings below may reference images not displayed]

FINDINGS: Brain: Moderate diffuse parenchymal volume loss with ex vacuo
dilatation. Scattered and confluent FLAIR hyperintense foci
involving the periventricular and subcortical white matter are
nonspecific however commonly associated with chronic microvascular
ischemic changes.

Remote left parietal microhemorrhage. No diffusion-weighted signal
abnormality. No midline shift, ventriculomegaly or extra-axial fluid
collection. No mass lesion.

Vascular: Normal flow voids.

Skull and upper cervical spine: Normal marrow signal.

Sinuses/Orbits: Normal orbits. Sequela of bilateral lens
replacement. Small left maxillary sinus mucous retention cyst. Trace
right mastoid effusion.

Other: None.
IMPRESSION: No acute intracranial process. Remote left parietal microhemorrhage.

Moderate cerebral atrophy. Advanced chronic microvascular ischemic
changes.

## 2021-12-03 ENCOUNTER — Other Ambulatory Visit: Payer: Self-pay | Admitting: Family Medicine

## 2021-12-03 DIAGNOSIS — I10 Essential (primary) hypertension: Secondary | ICD-10-CM

## 2021-12-03 DIAGNOSIS — E782 Mixed hyperlipidemia: Secondary | ICD-10-CM

## 2021-12-21 DIAGNOSIS — Z23 Encounter for immunization: Secondary | ICD-10-CM | POA: Diagnosis not present

## 2022-01-20 ENCOUNTER — Encounter: Payer: Self-pay | Admitting: Family Medicine

## 2022-01-20 ENCOUNTER — Ambulatory Visit (INDEPENDENT_AMBULATORY_CARE_PROVIDER_SITE_OTHER): Payer: Medicare Other | Admitting: Family Medicine

## 2022-01-20 VITALS — BP 180/82 | HR 53 | Temp 97.9°F | Ht 70.0 in | Wt 198.4 lb

## 2022-01-20 DIAGNOSIS — D692 Other nonthrombocytopenic purpura: Secondary | ICD-10-CM

## 2022-01-20 DIAGNOSIS — M19049 Primary osteoarthritis, unspecified hand: Secondary | ICD-10-CM | POA: Diagnosis not present

## 2022-01-20 DIAGNOSIS — R351 Nocturia: Secondary | ICD-10-CM

## 2022-01-20 DIAGNOSIS — L821 Other seborrheic keratosis: Secondary | ICD-10-CM | POA: Diagnosis not present

## 2022-01-20 DIAGNOSIS — I10 Essential (primary) hypertension: Secondary | ICD-10-CM

## 2022-01-20 DIAGNOSIS — G319 Degenerative disease of nervous system, unspecified: Secondary | ICD-10-CM | POA: Diagnosis not present

## 2022-01-20 DIAGNOSIS — J301 Allergic rhinitis due to pollen: Secondary | ICD-10-CM | POA: Diagnosis not present

## 2022-01-20 DIAGNOSIS — E782 Mixed hyperlipidemia: Secondary | ICD-10-CM | POA: Diagnosis not present

## 2022-01-20 DIAGNOSIS — Z125 Encounter for screening for malignant neoplasm of prostate: Secondary | ICD-10-CM

## 2022-01-20 DIAGNOSIS — R233 Spontaneous ecchymoses: Secondary | ICD-10-CM | POA: Diagnosis not present

## 2022-01-20 DIAGNOSIS — E538 Deficiency of other specified B group vitamins: Secondary | ICD-10-CM | POA: Diagnosis not present

## 2022-01-20 MED ORDER — AMLODIPINE BESYLATE 5 MG PO TABS: 5.0000 mg | ORAL_TABLET | Freq: Every day | ORAL | 3 refills | Status: DC

## 2022-01-20 MED ORDER — ATORVASTATIN CALCIUM 40 MG PO TABS: 40.0000 mg | ORAL_TABLET | Freq: Every day | ORAL | 3 refills | Status: DC

## 2022-01-20 MED ORDER — FLUTICASONE PROPIONATE 50 MCG/ACT NA SUSP: NASAL | 1 refills | Status: DC

## 2022-01-20 NOTE — Patient Instructions (Signed)
Seborrheic Keratosis A seborrheic keratosis is a common, noncancerous (benign) skin growth. These growths are velvety, waxy, or rough spots that appear on the skin. They are often tan, brown, or black. The skin growths can be flat or raised and may be scaly. What are the causes? The cause of this condition is not known. What increases the risk? You are more likely to develop this condition if you: Have a family history of seborrheic keratosis. Are 50 years old or older. Are pregnant. Have had estrogen replacement therapy. What are the signs or symptoms? Symptoms of this condition include growths on the face, chest, shoulders, back, or other areas. These growths: Are usually painless, but may become irritated and itchy. Can be tan, yellow, brown, black, or other colors. Are slightly raised or have a flat surface. Are sometimes rough or wart-like in texture. Are often velvety or waxy on the surface. Are round or oval-shaped. Often occur in groups, but may occur as a single growth. How is this diagnosed? This condition is diagnosed with a medical history and physical exam. A sample of the growth may be tested (skin biopsy). You may also need to see a skin specialist (dermatologist). How is this treated? Treatment is not usually needed for this condition unless the growths are irritated or bleed often. You may also choose to have the growths removed if you do not like their appearance. Growth removal may include a procedure in which: Liquid nitrogen is applied to "freeze" off the growth (cryosurgery). This is the most common procedure. The growth is burned off with electricity (electrocautery). The growth is removed by scraping (curettage). Follow these instructions at home: Watch your growth or growths for any changes. Do not scratch or pick at the growth or growths. This can cause them to become irritated or infected. Contact a health care provider if: You suddenly have many new  growths. Your growth bleeds, itches, or hurts. Your growth suddenly becomes larger or changes color. Summary A seborrheic keratosis is a common, noncancerous skin growth. Treatment is not usually needed for this condition unless the growths are irritated or bleed often. Watch your growth or growths for any changes. Contact a health care provider if you suddenly have many new growths or your growth suddenly becomes larger or changes color. This information is not intended to replace advice given to you by your health care provider. Make sure you discuss any questions you have with your health care provider. Document Revised: 06/21/2021 Document Reviewed: 06/21/2021 Elsevier Patient Education  2023 Elsevier Inc.  

## 2022-01-20 NOTE — Progress Notes (Signed)
David Guerrero is a 81 y.o. male presents to office today for annual physical exam examination.    Concerns today include: 1.  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  This has been an issue for him for quite some time.  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  He exercises regularly, at least 5 days/week for up to 2 hours/day.  He has used various topicals on his hands but nothing really has helped.  He has not utilized any orals because he was not quite sure what to select.  Denies any family history of autoimmune disease including rheumatoid arthritis.  He reports that the arthritis sometimes is so bad that he really avoids shaking hands with other folks because he does not want to exacerbate the pain.  2.  Skin lesion Patient reports a scaly skin lesion on the right side of his face.  He does not recall it being there previously and wants me to have a look at that.  He denies any spontaneous bleeding of the lesion but notes easy bleeding of the skin and multiple bruises on the arms whenever he bumps into something.  In fact he recently had a skin tear on the left arm.  Occupation: Retired, Marital status: Has girlfriend, Substance use: None Diet: Enjoys his desserts but otherwise very well-balanced, Exercise: 5 days/week for up to 2 hours/day.  Doing both strength training and cardio Last eye exam: Up-to-date Last dental exam: Up to date Last colonoscopy: Up-to-date Refills needed today: All Immunizations needed: Had flu shot done.  Awaiting COVID shot at his pharmacy Immunization History  Administered Date(s) Administered   Influenza, High Dose Seasonal PF 01/13/2015, 12/20/2015, 01/06/2017, 12/20/2019, 01/11/2021   Influenza-Unspecified 01/06/2017, 12/03/2017, 12/09/2018   Moderna Covid-19 Vaccine Bivalent Booster 72yr & up  01/22/2021   Moderna Sars-Covid-2 Vaccination 05/03/2019, 06/03/2019, 02/15/2020, 10/30/2020   Pneumococcal Conjugate-13 01/23/2015   Pneumococcal Polysaccharide-23 06/14/2007   Td 01/28/2017   Zoster Recombinat (Shingrix) 06/05/2020, 08/06/2020     Past Medical History:  Diagnosis Date   [place] ([place]    melanoma, removed apprx 2013, early stage per pt   Cataract 12/14/2008   Hyperlipidemia    Memory deficit    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   Social History   Socioeconomic History   Marital status: Significant Other    Spouse name: Not on file   Number of children: 1   Years of education: Not on file   Highest education level: Bachelor's degree (e.g., BA, AB, BS)  Occupational History   Occupation: Retired   Occupation: Self Employed    Comment: [phone] Tobacco Use   Smoking status: Former    Packs/day: 0.50    Years: 10.00    Total pack years: 5.00    Types: Cigarettes    Quit date: 12/10/1980    Years since quitting: 41.1   Smokeless tobacco: Never  Vaping Use   Vaping Use: Never used  Substance and Sexual Activity   Alcohol use: Yes    Alcohol/week: 2.0 - 3.0 standard drinks of alcohol    Types: 2 - 3 Glasses of wine per week   Drug use: No   Sexual activity: Not Currently  Other Topics Concern   Not on file  Social History Narrative   Lives alone   Social Determinants of Health   Financial  Resource Strain: Low Risk  (06/07/2021)   Overall Financial Resource Strain (CARDIA)    Difficulty of Paying Living Expenses: Not hard at all  Food Insecurity: No Food Insecurity (06/07/2021)   Hunger Vital Sign    Worried About Running Out of Food in the Last Year: Never true    Ran Out of Food in the Last Year: Never true  Transportation Needs: No Transportation Needs (06/07/2021)   PRAPARE - Hydrologist (Medical): No    Lack of Transportation (Non-Medical): No  Physical Activity: Sufficiently Active (06/07/2021)   Exercise Vital  Sign    Days of Exercise per Week: 5 days    Minutes of Exercise per Session: 60 min  Stress: No Stress Concern Present (06/07/2021)   [place]    Feeling of Stress : Not at all  Social Connections: Moderately Integrated (06/07/2021)   Social Connection and Isolation Panel [NHANES]    Frequency of Communication with Friends and Family: More than three times a week    Frequency of Social Gatherings with Friends and Family: More than three times a week    Attends Religious Services: More than 4 times per year    Active Member of [employer] or Organizations: Yes    Attends Archivist Meetings: More than 4 times per year    Marital Status: Widowed  Intimate Partner Violence: Not At Risk (06/07/2021)   Humiliation, Afraid, Rape, and Kick questionnaire    Fear of Current or Ex-Partner: No    Emotionally Abused: No    Physically Abused: No    Sexually Abused: No   Past Surgical History:  Procedure Laterality Date   EYE SURGERY  2010   Cataracts   MELANOMA EXCISION     L upper arm   Family History  Problem Relation Age of Onset   Arthritis Mother    Cancer Father        lung    Current Outpatient Medications:    amLODipine (NORVASC) 5 MG tablet, TAKE 1 TABLET (5 MG TOTAL) BY MOUTH DAILY., Disp: 90 tablet, Rfl: 0   atorvastatin (LIPITOR) 40 MG tablet, TAKE 1 TABLET BY MOUTH DAILY AT 6 PM., Disp: 90 tablet, Rfl: 0   Cyanocobalamin (VITAMIN B 12 PO), Take by mouth., Disp: , Rfl:    fluticasone (FLONASE) 50 MCG/ACT nasal spray, SPRAY 2 SPRAYS INTO EACH NOSTRIL EVERY DAY, Disp: 48 mL, Rfl: 1   loratadine (CLARITIN) 10 MG tablet, TAKE 1 TABLET BY MOUTH EVERY DAY, Disp: 90 tablet, Rfl: 3   Multiple Vitamin (MULTIVITAMIN WITH MINERALS) TABS tablet, Take 1 tablet by mouth daily., Disp: , Rfl:   No Known Allergies   ROS: Review of Systems A comprehensive review of systems was negative except for: Genitourinary:  positive for frequency and nocturia Hematologic/lymphatic: positive for bleeding and easy bruising Musculoskeletal: positive for arthralgias    Physical exam BP (!) 180/82   Pulse (!) 53   Temp 97.9 F (36.6 C)   Ht '5\' 10"'  (1.778 m)   Wt 198 lb 6.4 oz (90 kg)   SpO2 95%   BMI 28.47 kg/m  General appearance: alert, cooperative, appears stated age, and no distress Head: Normocephalic, without obvious abnormality, atraumatic Eyes: negative findings: lids and lashes normal, conjunctivae and sclerae normal, corneas clear, and pupils equal, round, reactive to light and accomodation Ears: normal TM's and external ear canals both ears Nose: Nares normal. Septum midline. Mucosa normal.  No drainage or sinus tenderness. Throat: lips, mucosa, and tongue normal; teeth and gums normal Neck: no adenopathy, no carotid bruit, supple, symmetrical, trachea midline, and thyroid not enlarged, symmetric, no tenderness/mass/nodules Back: symmetric, no curvature. ROM normal. No CVA tenderness. Lungs: clear to auscultation bilaterally Chest wall: no tenderness Heart: regular rate and rhythm, S1, S2 normal, no murmur, click, rub or gallop Abdomen: soft, non-tender; bowel sounds normal; no masses,  no organomegaly. Diastasis recti present Extremities: extremities normal, atraumatic, no cyanosis or edema Pulses: 2+ and symmetric Skin:  Multiple senile purpura noted along bilateral upper extremities with this shallow skin tear noted along the dorsum of the left upper extremity.  He has several seborrheic keratoses, 1 of which is on the right lateral side of his face. Lymph nodes: Cervical, supraclavicular, and axillary nodes normal. Neurologic: Grossly normal Psych: Mood stable, speech normal, affect appropriate  Flowsheet Row Office Visit from 01/20/2022 in [facility]  PHQ-2 Total Score 0           01/20/2022    8:30 AM 03/27/2020   12:45 PM 03/01/2020    8:53 AM  MMSE - Mini  Mental State Exam  Orientation to time '5 5 5  ' Orientation to Place '5 5 5  ' Registration '3 3 3  ' Attention/ Calculation '4 4 5  ' Recall '2 2 3  ' Language- name 2 objects '1 2 2  ' Language- repeat '1 1 1  ' Language- follow 3 step command '3 3 3  ' Language- read & follow direction '1 1 1  ' Write a sentence '1 1 1  ' Copy design '1 1 1  ' Total score '27 28 30     ' Assessment/ Plan: [name] here for annual physical exam.   Essential hypertension - Plan: CMP14+EGFR  Mixed hyperlipidemia - Plan: CMP14+EGFR, Lipid Panel, TSH  B12 deficiency - Plan: Vitamin B12  Cerebral atrophy ([place])  Nocturia - Plan: PSA  Screening for malignant neoplasm of prostate - Plan: PSA  Arthritis of hand - Plan: ANA w/Reflex if Positive, C-reactive protein, Sedimentation Rate, CBC, Rheumatoid factor  Easy bruisability - Plan: CBC  Seborrheic keratoses  Purpura senilis ([place])  Blood pressure is NOT controlled upon recheck.  Check blood pressure with nurse in 2 weeks.  If [place] above 150/90, plan to advance Norvasc to 10 mg daily.  Check fasting labs.  Continue statin  B12 level collected today given history of B12 deficiency  MMSE without any significant drop.  This is still considered normal range  PSA collected given reports of nocturia and urinary frequency.  Though I suspect this may be related to increased H2O consumption  I suspect that the arthritis noted in bilateral hands is likely osteoarthritis we will look for any autoimmune mediated doses.  I have advised Tylenol arthritis.  We will plan to recheck in the next 2 to 3 months and if no significant improvement, we can consider starting oral NSAID.  We discussed the risks of NSAID.  Given his easy bruisability, easy bleeding, will check CBC but we discussed that this is likely secondary to thin skin.  Continue moisturization  Lesion of concern on the face is a seborrheic keratosis.  We discussed benign nature of this and handout was  provided  Counseled healthy lifestyle choices including moderate physical activity daily and well-balanced diet.  Jlyn Bracamonte M. Lajuana Ripple, DO

## 2022-01-21 LAB — CBC
Hematocrit: 45.6 % (ref 37.5–51.0)
Hemoglobin: 15.3 g/dL (ref 13.0–17.7)
MCH: 31.8 pg (ref 26.6–33.0)
MCHC: 33.6 g/dL (ref 31.5–35.7)
MCV: 95 fL (ref 79–97)
Platelets: 157 10*3/uL (ref 150–450)
RBC: 4.81 x10E6/uL (ref 4.14–5.80)
RDW: 12.1 % (ref 11.6–15.4)
WBC: 5 10*3/uL (ref 3.4–10.8)

## 2022-01-21 LAB — CMP14+EGFR
ALT: 20 IU/L (ref 0–44)
AST: 22 IU/L (ref 0–40)
Albumin/Globulin Ratio: 1.7 (ref 1.2–2.2)
Albumin: 4.2 g/dL (ref 3.7–4.7)
Alkaline Phosphatase: 65 IU/L (ref 44–121)
BUN/Creatinine Ratio: 13 (ref 10–24)
BUN: 10 mg/dL (ref 8–27)
Bilirubin Total: 0.8 mg/dL (ref 0.0–1.2)
CO2: 21 mmol/L (ref 20–29)
Calcium: 9.3 mg/dL (ref 8.6–10.2)
Chloride: 104 mmol/L (ref 96–106)
Creatinine, Ser: 0.77 mg/dL (ref 0.76–1.27)
Globulin, Total: 2.5 g/dL (ref 1.5–4.5)
Glucose: 106 mg/dL — ABNORMAL HIGH (ref 70–99)
Potassium: 3.9 mmol/L (ref 3.5–5.2)
Sodium: 143 mmol/L (ref 134–144)
Total Protein: 6.7 g/dL (ref 6.0–8.5)
eGFR: 90 mL/min/{1.73_m2} (ref >59)

## 2022-01-21 LAB — SEDIMENTATION RATE: Sed Rate: 3 mm/hr (ref 0–30)

## 2022-01-21 LAB — LIPID PANEL
Chol/HDL Ratio: 2.6 ratio (ref 0.0–5.0)
Cholesterol, Total: 159 mg/dL (ref 100–199)
HDL: 61 mg/dL (ref >39)
LDL Chol Calc (NIH): 78 mg/dL (ref 0–99)
Triglycerides: 112 mg/dL (ref 0–149)
VLDL Cholesterol Cal: 20 mg/dL (ref 5–40)

## 2022-01-21 LAB — RHEUMATOID FACTOR: Rheumatoid fact SerPl-aCnc: 10.7 IU/mL (ref <14.0)

## 2022-01-21 LAB — PSA: Prostate Specific Ag, Serum: 2.7 ng/mL (ref 0.0–4.0)

## 2022-01-21 LAB — C-REACTIVE PROTEIN: CRP: <1 mg/L (ref 0–10)

## 2022-01-21 LAB — VITAMIN B12: Vitamin B-12: 702 pg/mL (ref 232–1245)

## 2022-01-21 LAB — ANA W/REFLEX IF POSITIVE: Anti Nuclear Antibody (ANA): NEGATIVE

## 2022-01-21 LAB — TSH: TSH: 2.36 u[IU]/mL (ref 0.450–4.500)

## 2022-01-23 ENCOUNTER — Other Ambulatory Visit: Payer: Self-pay | Admitting: Family Medicine

## 2022-01-23 DIAGNOSIS — J301 Allergic rhinitis due to pollen: Secondary | ICD-10-CM

## 2022-01-24 DIAGNOSIS — Z23 Encounter for immunization: Secondary | ICD-10-CM | POA: Diagnosis not present

## 2022-02-03 ENCOUNTER — Ambulatory Visit: Payer: Medicare Other

## 2022-02-10 ENCOUNTER — Telehealth: Payer: Self-pay | Admitting: Family Medicine

## 2022-02-10 ENCOUNTER — Ambulatory Visit: Payer: Medicare Other

## 2022-02-10 DIAGNOSIS — Z0130 Encounter for examination of blood pressure without abnormal findings: Secondary | ICD-10-CM

## 2022-02-10 NOTE — Progress Notes (Signed)
Patient here today for blood pressure check. Blood pressure is 157/63, pulse 65 Patient sat for a few minutes and blood pressure was rechecked at 147/68, pulse 65

## 2022-02-10 NOTE — Telephone Encounter (Signed)
PT AWARE WE WILL CALL ONCE WE HAVE RECOMMENDATIONS

## 2022-04-02 ENCOUNTER — Telehealth: Payer: Self-pay | Admitting: Family Medicine

## 2022-04-02 NOTE — Telephone Encounter (Signed)
Patient concerned about his BP-  12/10- 168/83 hr-58 12/11- 174/68 hr- 65 12/12- 139/77 hr- 72 12/13- 139/64 hr- 66   Covering pcp- please advise

## 2022-04-03 NOTE — Telephone Encounter (Signed)
Pt aware of recommendations

## 2022-04-28 ENCOUNTER — Ambulatory Visit (INDEPENDENT_AMBULATORY_CARE_PROVIDER_SITE_OTHER): Payer: Medicare Other | Admitting: Family Medicine

## 2022-04-28 ENCOUNTER — Encounter: Payer: Self-pay | Admitting: Family Medicine

## 2022-04-28 VITALS — BP 138/70 | HR 71 | Temp 98.3°F | Ht 70.0 in | Wt 200.2 lb

## 2022-04-28 DIAGNOSIS — M19049 Primary osteoarthritis, unspecified hand: Secondary | ICD-10-CM

## 2022-04-28 DIAGNOSIS — H60543 Acute eczematoid otitis externa, bilateral: Secondary | ICD-10-CM | POA: Insufficient documentation

## 2022-04-28 DIAGNOSIS — I10 Essential (primary) hypertension: Secondary | ICD-10-CM | POA: Diagnosis not present

## 2022-04-28 NOTE — Progress Notes (Signed)
Subjective: VZ:CHYIFOY arthritis/ BP PCP: Janora Norlander, DO DXA:JOINO C David Guerrero is a 82 y.o. male presenting to clinic today for:  1. HTN Patient reports compliance with Norvasc 5 mg daily.  He notes that he has had some fluctuating blood pressures.  On average he runs 676H to 209O systolic over 70J diastolic but he does occasionally have blood pressures above 150 and sometimes even to the 160s and 170s.  No chest pain, shortness of breath, dizziness or swelling reported.  2. OA hands Tylenol arthritis recommended last visit.  Continues to have quite a bit of arthritis in bilateral hands.  Has been utilizing an arthritis cream which does help but he notes ongoing weakness of bilateral hands.  He has to make adjustments to open jars, caps etc.  He has been doing some stretches in efforts to alleviate some of the discomfort.  3.  Otitis Patient reports bilateral ear itching right greater than left.  He feels like his hearing is good and he does not worry about wax but is unsure as to why he continues to have ear itching.  He takes Claritin daily.  He does note wearing ear buds and does clean these with rubbing alcohol frequently.  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 For cholesterol, Disp: 90 tablet, Rfl: 3   Cyanocobalamin (VITAMIN B 12 PO), Take by mouth., Disp: , Rfl:    fluticasone (FLONASE) 50 MCG/ACT nasal spray, SPRAY 2 SPRAYS INTO EACH NOSTRIL EVERY DAY, Disp: 48 mL, Rfl: 1   loratadine (CLARITIN) 10 MG  tablet, TAKE 1 TABLET BY MOUTH EVERY DAY, Disp: 90 tablet, Rfl: 3   Multiple Vitamin (MULTIVITAMIN WITH MINERALS) TABS tablet, Take 1 tablet by mouth daily., Disp: , Rfl:  Social History   Socioeconomic History   Marital status: Significant Other    Spouse name: Not on file   Number of children: 1   Years of education: Not on file   Highest education level: Bachelor's degree (e.g., BA, AB, BS)  Occupational History   Occupation: Retired   Occupation: Self Employed    Comment: [phone]  Tobacco Use   Smoking status: Former    Packs/day: 0.50    Years: 10.00    Total pack years: 5.00    Types: Cigarettes    Quit date: 12/10/1980    Years since quitting: 41.4   Smokeless tobacco: Never  Vaping Use   Vaping Use: Never used  Substance and Sexual Activity   Alcohol use: Yes    Alcohol/week: 2.0 - 3.0 standard drinks of alcohol    Types: 2 - 3 Glasses of wine per week   Drug use: No   Sexual activity: Not Currently  Other Topics Concern   Not on file  Social History Narrative   Lives alone   Social Determinants of Health   Financial Resource Strain: Low Risk  (06/07/2021)   Overall Financial Resource Strain (CARDIA)  Difficulty of Paying Living Expenses: Not hard at all  Food Insecurity: No Food Insecurity (06/07/2021)   Hunger Vital Sign    Worried About Running Out of Food in the Last Year: Never true    Ran Out of Food in the Last Year: Never true  Transportation Needs: No Transportation Needs (06/07/2021)   PRAPARE - Hydrologist (Medical): No    Lack of Transportation (Non-Medical): No  Physical Activity: Sufficiently Active (06/07/2021)   Exercise Vital Sign    Days of Exercise per Week: 5 days    Minutes of Exercise per Session: 60 min  Stress: No Stress Concern Present (06/07/2021)   [place]    Feeling of Stress : Not at all  Social Connections: Moderately Integrated  (06/07/2021)   Social Connection and Isolation Panel [NHANES]    Frequency of Communication with Friends and Family: More than three times a week    Frequency of Social Gatherings with Friends and Family: More than three times a week    Attends Religious Services: More than 4 times per year    Active Member of [employer] or Organizations: Yes    Attends Archivist Meetings: More than 4 times per year    Marital Status: Widowed  Intimate Partner Violence: Not At Risk (06/07/2021)   Humiliation, Afraid, Rape, and Kick questionnaire    Fear of Current or Ex-Partner: No    Emotionally Abused: No    Physically Abused: No    Sexually Abused: No   Family History  Problem Relation Age of Onset   Arthritis Mother    Cancer Father        lung    Objective: Office vital signs reviewed. BP 138/70   Pulse 71   Temp 98.3 F (36.8 C)   Ht '5\' 10"'$  (1.778 m)   Wt 200 lb 3.2 oz (90.8 kg)   SpO2 94%   BMI 28.73 kg/m   Physical Examination:  General: Awake, alert, well nourished, No acute distress HEENT: TMs intact bilaterally.  He has some narrowing and flaking of the skin of the external auditory canals bilaterally. Cardio: regular rate and rhythm, S1S2 heard, no murmurs appreciated Pulm: clear to auscultation bilaterally, no wheezes, rhonchi or rales; normal work of breathing on room air MSK: Has some mild osteoarthritic changes to the DIPs but no ulnar deviation, soft tissue swelling, erythema or warmth of the hands.  Would say that his grip strength is not as strong as I would expect but he is able to form a fist and has full active range of motion of bilateral hands    Assessment/ Plan: 82 y.o. male   Arthritis of hand  Essential hypertension  Dermatitis of both ear canals  Tylenol arthritis up to 3 times daily as needed recommended.  Okay to continue topical analgesic if helpful.  We discussed potential for PT and OT for that strength  Blood pressure is controlled upon  recheck.  We discussed how to properly check blood pressure at home.  We will plan to advance the amlodipine to 10 mg daily if blood pressure continues to be above 150/90.  He will drop off a 2-week blood pressure log and then follow-up with me in 3 to 4 months, sooner if concerns arise  For the external auditory canal dermatitis, he did have some irritation and flaking of the skin.  I recommended use of over-the-counter cortisone cream to the tip  of his finger apply twice daily up to 1 week as needed for ear irritation.  Consider cleaning earbuds with warm wet rag than utilization of the alcohol  No orders of the defined types were placed in this encounter.  No orders of the defined types were placed in this encounter.    Janora Norlander, DO [place] [phone]

## 2022-04-28 NOTE — Patient Instructions (Signed)
Hydrocortisone cream up to twice daily as needed for ear itching Tylenol arthritis up to 3 times daily as needed for arthritis. Ok to use the topical cream if needed as well Check Blood pressure daily x2 weeks and bring me log.  If persistently >150/90, will double Amlodipine to '10mg'$  daily.  How to Take Your Blood Pressure Blood pressure measures how strongly your blood is pressing against the walls of your arteries. Arteries are blood vessels that carry blood from your heart throughout your body. You can take your blood pressure at home with a machine. You may need to check your blood pressure at home: To check if you have high blood pressure (hypertension). To check your blood pressure over time. To make sure your blood pressure medicine is working. Supplies needed: Blood pressure machine, or monitor. A chair to sit in. This should be a chair where you can sit upright with your back supported. Do not sit on a soft couch or an armchair. Table or desk. Small notebook. Pencil or pen. How to prepare Avoid these things for 30 minutes before checking your blood pressure: Having drinks with caffeine in them, such as coffee or tea. Drinking alcohol. Eating. Smoking. Exercising. Do these things five minutes before checking your blood pressure: Go to the bathroom and pee (urinate). Sit in a chair. Be quiet. Do not talk. How to take your blood pressure Follow the instructions that came with your machine. If you have a digital blood pressure monitor, these may be the instructions: Sit up straight. Place your feet on the floor. Do not cross your ankles or legs. Rest your left arm at the level of your heart. You may rest it on a table, desk, or chair. Pull up your shirt sleeve. Wrap the blood pressure cuff around the upper part of your left arm. The cuff should be 1 inch (2.5 cm) above your elbow. It is best to wrap the cuff around bare skin. Fit the cuff snugly around your arm, but not too  tightly. You should be able to place only one finger between the cuff and your arm. Place the cord so that it rests in the bend of your elbow. Press the power button. Sit quietly while the cuff fills with air and loses air. Write down the numbers on the screen. Wait 2-3 minutes and then repeat steps 1-10. What do the numbers mean? Two numbers make up your blood pressure. The first number is called systolic pressure. The second is called diastolic pressure. An example of a blood pressure reading is "120 over 80" (or 120/80). If you are an adult and do not have a medical condition, use this guide to find out if your blood pressure is normal: Normal First number: below 120. Second number: below 80. Elevated First number: 120-129. Second number: below 80. Hypertension stage 1 First number: 130-139. Second number: 80-89. Hypertension stage 2 First number: 140 or above. Second number: 65 or above. Your blood pressure is above normal even if only the first or only the second number is above normal. Follow these instructions at home: Medicines Take over-the-counter and prescription medicines only as told by your doctor. Tell your doctor if your medicine is causing side effects. General instructions Check your blood pressure as often as your doctor tells you to. Check your blood pressure at the same time every day. Take your monitor to your next doctor's appointment. Your doctor will: Make sure you are using it correctly. Make sure it is working right.  Understand what your blood pressure numbers should be. Keep all follow-up visits. General tips You will need a blood pressure machine or monitor. Your doctor can suggest a monitor. You can buy one at a drugstore or online. When choosing one: Choose one with an arm cuff. Choose one that wraps around your upper arm. Only one finger should fit between your arm and the cuff. Do not choose one that measures your blood pressure from your wrist  or finger. Where to find more information American Heart Association: www.heart.org Contact a doctor if: Your blood pressure keeps being high. Your blood pressure is suddenly low. Get help right away if: Your first blood pressure number is higher than 180. Your second blood pressure number is higher than 120. These symptoms may be an emergency. Do not wait to see if the symptoms will go away. Get help right away. Call 911. Summary Check your blood pressure at the same time every day. Avoid caffeine, alcohol, smoking, and exercise for 30 minutes before checking your blood pressure. Make sure you understand what your blood pressure numbers should be. This information is not intended to replace advice given to you by your health care provider. Make sure you discuss any questions you have with your health care provider. Document Revised: 12/20/2020 Document Reviewed: 12/20/2020 Elsevier Patient Education  [place].

## 2022-05-13 ENCOUNTER — Encounter: Payer: Self-pay | Admitting: Family Medicine

## 2022-05-15 ENCOUNTER — Telehealth: Payer: Self-pay | Admitting: Family Medicine

## 2022-05-16 ENCOUNTER — Other Ambulatory Visit: Payer: Self-pay | Admitting: Family Medicine

## 2022-05-16 DIAGNOSIS — I10 Essential (primary) hypertension: Secondary | ICD-10-CM

## 2022-05-16 MED ORDER — AMLODIPINE BESYLATE 5 MG PO TABS: 7.5000 mg | ORAL_TABLET | Freq: Every day | ORAL | 3 refills | Status: DC

## 2022-05-16 NOTE — Telephone Encounter (Signed)
I did.  Increase Norvasc to 1.5 tablets daily.  New rx sent.

## 2022-05-20 ENCOUNTER — Telehealth: Payer: Self-pay | Admitting: Family Medicine

## 2022-05-20 NOTE — Telephone Encounter (Signed)
Patient needed clarification on blood pressure medicine.  He was unsure about how he was take, patient informed to take 1-1/2 pills daily.

## 2022-06-05 DIAGNOSIS — H26491 Other secondary cataract, right eye: Secondary | ICD-10-CM | POA: Diagnosis not present

## 2022-06-05 DIAGNOSIS — H01001 Unspecified blepharitis right upper eyelid: Secondary | ICD-10-CM | POA: Diagnosis not present

## 2022-06-05 DIAGNOSIS — H01002 Unspecified blepharitis right lower eyelid: Secondary | ICD-10-CM | POA: Diagnosis not present

## 2022-06-05 DIAGNOSIS — H35371 Puckering of macula, right eye: Secondary | ICD-10-CM | POA: Diagnosis not present

## 2022-06-16 ENCOUNTER — Telehealth: Payer: Self-pay | Admitting: Family Medicine

## 2022-06-16 NOTE — Telephone Encounter (Signed)
Contacted [name] II to schedule their annual wellness visit. Appointment made for 07/02/2022.  Thank you,  Colletta [place],  [facility] Direct Dial ??[phone]

## 2022-06-19 ENCOUNTER — Encounter: Payer: Self-pay | Admitting: Family Medicine

## 2022-06-19 ENCOUNTER — Ambulatory Visit (INDEPENDENT_AMBULATORY_CARE_PROVIDER_SITE_OTHER): Payer: Medicare Other | Admitting: Family Medicine

## 2022-06-19 VITALS — BP 159/76 | HR 77 | Temp 98.0°F | Ht 70.0 in | Wt 200.2 lb

## 2022-06-19 DIAGNOSIS — J301 Allergic rhinitis due to pollen: Secondary | ICD-10-CM

## 2022-06-19 MED ORDER — LEVOCETIRIZINE DIHYDROCHLORIDE 5 MG PO TABS: 5.0000 mg | ORAL_TABLET | Freq: Every evening | ORAL | 1 refills | Status: DC

## 2022-06-19 NOTE — Progress Notes (Signed)
Subjective:  Patient ID: David Guerrero, male    DOB: May 27, 1940, 82 y.o.   MRN: [id number]  Patient Care Team: Janora Norlander, DO as PCP - General (Family Medicine) Loney Loh, MD (Dermatology) Celestia Khat, [place] (Optometry)   Chief Complaint:  sneezing, Nasal Congestion, and Sore Throat (Started at 12am )   HPI: David Guerrero is a 82 y.o. male presenting on 06/19/2022 for sneezing, Nasal Congestion, and Sore Throat (Started at [facility] )   Pt presents today for evaluation of rhinorrhea, cough, and slight sore throat. States this started at 1230 am today. He has not taken anything for his symptoms. He has been taking Claritin for several years. Has not been using his Flonase.  Sore Throat  This is a new problem. Episode onset: last night. The problem has been waxing and waning. Neither side of throat is experiencing more pain than the other. There has been no fever. The pain is mild. Associated symptoms include congestion and coughing. Pertinent negatives include no abdominal pain, diarrhea, drooling, ear discharge, ear pain, headaches, hoarse voice, plugged ear sensation, neck pain, shortness of breath, stridor, swollen glands, trouble swallowing or vomiting. He has had no exposure to strep or mono. He has tried nothing for the symptoms.       Relevant past medical, surgical, family, and social history reviewed and updated as indicated.  Allergies and medications reviewed and updated. Data reviewed: Chart in Epic.   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    Past Surgical History:  Procedure Laterality Date   EYE SURGERY  2010   Cataracts   MELANOMA EXCISION     L upper arm    Social History   Socioeconomic History   Marital status: Significant Other    Spouse name: Not on file   Number of  children: 1   Years of education: Not on file   Highest education level: Bachelor's degree (e.g., BA, AB, BS)  Occupational History   Occupation: Retired   Occupation: Self Employed    Comment: [phone]  Tobacco Use   Smoking status: Former    Packs/day: 0.50    Years: 10.00    Total pack years: 5.00    Types: Cigarettes    Quit date: 12/10/1980    Years since quitting: 41.5   Smokeless tobacco: Never  Vaping Use   Vaping Use: Never used  Substance and Sexual Activity   Alcohol use: Yes    Alcohol/week: 2.0 - 3.0 standard drinks of alcohol    Types: 2 - 3 Glasses of wine per week   Drug use: No   Sexual activity: Not Currently  Other Topics Concern   Not on file  Social History Narrative   Lives alone   Social Determinants of Health   Financial Resource Strain: Low Risk  (06/07/2021)   Overall Financial Resource Strain (CARDIA)    Difficulty of Paying Living Expenses: Not hard at all  Food Insecurity: No Food Insecurity (06/07/2021)   Hunger Vital Sign    Worried About Running Out of Food in the Last Year: Never true    Ran Out of Food in the Last Year: Never true  Transportation Needs: No Transportation Needs (06/07/2021)   PRAPARE - Transportation  Lack of Transportation (Medical): No    Lack of Transportation (Non-Medical): No  Physical Activity: Sufficiently Active (06/07/2021)   Exercise Vital Sign    Days of Exercise per Week: 5 days    Minutes of Exercise per Session: 60 min  Stress: No Stress Concern Present (06/07/2021)   [place]    Feeling of Stress : Not at all  Social Connections: Moderately Integrated (06/07/2021)   Social Connection and Isolation Panel [NHANES]    Frequency of Communication with Friends and Family: More than three times a week    Frequency of Social Gatherings with Friends and Family: More than three times a week    Attends Religious Services: More than 4 times per year     Active Member of [employer] or Organizations: Yes    Attends Archivist Meetings: More than 4 times per year    Marital Status: Widowed  Intimate Partner Violence: Not At Risk (06/07/2021)   Humiliation, Afraid, Rape, and Kick questionnaire    Fear of Current or Ex-Partner: No    Emotionally Abused: No    Physically Abused: No    Sexually Abused: No    Outpatient Encounter Medications as of 06/19/2022  Medication Sig   amLODipine (NORVASC) 5 MG tablet Take 1.5 tablets (7.5 mg total) by mouth daily.   atorvastatin (LIPITOR) 40 MG tablet Take 1 tablet (40 mg total) by mouth daily. For cholesterol   Cyanocobalamin (VITAMIN B 12 PO) Take by mouth.   fluticasone (FLONASE) 50 MCG/ACT nasal spray SPRAY 2 SPRAYS INTO EACH NOSTRIL EVERY DAY   levocetirizine (XYZAL) 5 MG tablet Take 1 tablet (5 mg total) by mouth every evening.   Multiple Vitamin (MULTIVITAMIN WITH MINERALS) TABS tablet Take 1 tablet by mouth daily.   [DISCONTINUED] loratadine (CLARITIN) 10 MG tablet TAKE 1 TABLET BY MOUTH EVERY DAY   No facility-administered encounter medications on file as of 06/19/2022.    No Known Allergies  Review of Systems  Constitutional:  Negative for activity change, appetite change, chills, diaphoresis, fatigue, fever and unexpected weight change.  HENT:  Positive for congestion, postnasal drip, rhinorrhea, sneezing and sore throat. Negative for dental problem, drooling, ear discharge, ear pain, facial swelling, hearing loss, hoarse voice, mouth sores, nosebleeds, sinus pressure, sinus pain, tinnitus, trouble swallowing and voice change.   Eyes:  Negative for photophobia and visual disturbance.  Respiratory:  Positive for cough. Negative for apnea, choking, chest tightness, shortness of breath, wheezing and stridor.   Cardiovascular:  Negative for chest pain, palpitations and leg swelling.  Gastrointestinal:  Negative for abdominal pain, diarrhea and vomiting.  Endocrine: Negative for  polydipsia, polyphagia and polyuria.  Genitourinary:  Negative for decreased urine volume and difficulty urinating.  Musculoskeletal:  Negative for neck pain.  Neurological:  Negative for dizziness, tremors, seizures, syncope, facial asymmetry, speech difficulty, weakness, light-headedness, numbness and headaches.  Psychiatric/Behavioral:  Negative for confusion.   All other systems reviewed and are negative.       Objective:  BP (!) 159/76   Pulse 77   Temp 98 F (36.7 C) (Temporal)   Ht '5\' 10"'$  (1.778 m)   Wt 200 lb 3.2 oz (90.8 kg)   SpO2 97%   BMI 28.73 kg/m    Wt Readings from Last 3 Encounters:  06/19/22 200 lb 3.2 oz (90.8 kg)  04/28/22 200 lb 3.2 oz (90.8 kg)  01/20/22 198 lb 6.4 oz (90 kg)    Physical Exam Vitals  and nursing note reviewed.  Constitutional:      General: He is not in acute distress.    Appearance: Normal appearance. He is well-developed. He is not ill-appearing, toxic-appearing or diaphoretic.  HENT:     Head: Normocephalic and atraumatic.     Right Ear: Tympanic membrane and ear canal normal.     Left Ear: Tympanic membrane and ear canal normal.     Nose: Congestion present.     Mouth/Throat:     Mouth: Mucous membranes are moist.     Pharynx: Posterior oropharyngeal erythema present. No oropharyngeal exudate.     Comments: Cobblestoning, postnasal drainge Eyes:     Conjunctiva/sclera: Conjunctivae normal.     Pupils: Pupils are equal, round, and reactive to light.  Cardiovascular:     Rate and Rhythm: Normal rate and regular rhythm.     Heart sounds: No murmur heard.    No friction rub. No gallop.  Pulmonary:     Effort: Pulmonary effort is normal.     Breath sounds: Normal breath sounds.  Musculoskeletal:     Cervical back: Neck supple.     Right lower leg: No edema.     Left lower leg: No edema.  Lymphadenopathy:     Cervical: No cervical adenopathy.  Skin:    General: Skin is warm and dry.     Capillary Refill: Capillary refill  takes less than 2 seconds.  Neurological:     General: No focal deficit present.     Mental Status: He is alert and oriented to person, place, and time.  Psychiatric:        Mood and Affect: Mood normal.        Behavior: Behavior normal.        Thought Content: Thought content normal.        Judgment: Judgment normal.     Results for orders placed or performed in visit on 01/20/22  Vitamin B12  Result Value Ref Range   Vitamin B-12 702 232 - 1,245 pg/mL  CMP14+EGFR  Result Value Ref Range   Glucose 106 (H) 70 - 99 mg/dL   BUN 10 8 - 27 mg/dL   Creatinine, Ser 0.77 0.76 - 1.27 mg/dL   eGFR 90 >59 mL/min/1.73   BUN/Creatinine Ratio 13 10 - 24   Sodium 143 134 - 144 mmol/L   Potassium 3.9 3.5 - 5.2 mmol/L   Chloride 104 96 - 106 mmol/L   CO2 21 20 - 29 mmol/L   Calcium 9.3 8.6 - 10.2 mg/dL   Total Protein 6.7 6.0 - 8.5 g/dL   Albumin 4.2 3.7 - 4.7 g/dL   Globulin, Total 2.5 1.5 - 4.5 g/dL   Albumin/Globulin Ratio 1.7 1.2 - 2.2   Bilirubin Total 0.8 0.0 - 1.2 mg/dL   Alkaline Phosphatase 65 44 - 121 IU/L   AST 22 0 - 40 IU/L   ALT 20 0 - 44 IU/L  Lipid Panel  Result Value Ref Range   Cholesterol, Total 159 100 - 199 mg/dL   Triglycerides 112 0 - 149 mg/dL   HDL 61 >39 mg/dL   VLDL Cholesterol Cal 20 5 - 40 mg/dL   LDL Chol Calc (NIH) 78 0 - 99 mg/dL   Chol/HDL Ratio 2.6 0.0 - 5.0 ratio  TSH  Result Value Ref Range   TSH 2.360 0.450 - 4.500 uIU/mL  PSA  Result Value Ref Range   Prostate Specific Ag, Serum 2.7 0.0 - 4.0 ng/mL  ANA w/Reflex  if Positive  Result Value Ref Range   Anti Nuclear Antibody (ANA) Negative Negative  C-reactive protein  Result Value Ref Range   CRP <1 0 - 10 mg/L  Sedimentation Rate  Result Value Ref Range   Sed Rate 3 0 - 30 mm/hr  CBC  Result Value Ref Range   WBC 5.0 3.4 - 10.8 x10E3/uL   RBC 4.81 4.14 - 5.80 x10E6/uL   Hemoglobin 15.3 13.0 - 17.7 g/dL   Hematocrit 45.6 37.5 - 51.0 %   MCV 95 79 - 97 fL   MCH 31.8 26.6 - 33.0 pg    MCHC 33.6 31.5 - 35.7 g/dL   RDW 12.1 11.6 - 15.4 %   Platelets 157 150 - 450 x10E3/uL  Rheumatoid factor  Result Value Ref Range   Rhuematoid fact SerPl-aCnc 10.7 <14.0 IU/mL       Pertinent labs & imaging results that were available during my care of the patient were reviewed by me and considered in my medical decision making.  Assessment & Plan:  David Guerrero was seen today for sneezing, nasal congestion and sore throat.  Diagnoses and all orders for this visit:  Seasonal allergic rhinitis due to pollen Will change antihistamine today and restart Flonase. Symptomatic care discussed in detail. Aware to report new, worsening, or persistent symptoms.  -     levocetirizine (XYZAL) 5 MG tablet; Take 1 tablet (5 mg total) by mouth every evening.     Continue all other maintenance medications.  Follow up plan: Return if symptoms worsen or fail to improve.   Continue healthy lifestyle choices, including diet (rich in fruits, vegetables, and lean proteins, and low in salt and simple carbohydrates) and exercise (at least 30 minutes of moderate physical activity daily).  Educational handout given for allergic rhinitis  The above assessment and management plan was discussed with the patient. The patient verbalized understanding of and has agreed to the management plan. Patient is aware to call the clinic if they develop any new symptoms or if symptoms persist or worsen. Patient is aware when to return to the clinic for a follow-up visit. Patient educated on when it is appropriate to go to the emergency department.   Monia Pouch, FNP-C [facility] [phone]

## 2022-06-20 ENCOUNTER — Ambulatory Visit: Payer: Medicare Other

## 2022-06-23 ENCOUNTER — Ambulatory Visit (INDEPENDENT_AMBULATORY_CARE_PROVIDER_SITE_OTHER): Payer: Medicare Other | Admitting: Nurse Practitioner

## 2022-06-23 ENCOUNTER — Encounter: Payer: Self-pay | Admitting: Nurse Practitioner

## 2022-06-23 VITALS — BP 146/81 | HR 61 | Temp 97.6°F | Resp 20 | Ht 70.0 in | Wt 197.0 lb

## 2022-06-23 DIAGNOSIS — J301 Allergic rhinitis due to pollen: Secondary | ICD-10-CM | POA: Diagnosis not present

## 2022-06-23 MED ORDER — PREDNISONE 20 MG PO TABS: 40.0000 mg | ORAL_TABLET | Freq: Every day | ORAL | 0 refills | Status: AC

## 2022-06-23 NOTE — Patient Instructions (Signed)
Allergic Rhinitis, Adult  Allergic rhinitis is a reaction to allergens. Allergens are things that can cause an allergic reaction. This condition affects the lining inside the nose (mucous membrane). There are two types of allergic rhinitis: Seasonal. This type is also called hay fever. It happens only during some times of the year. Perennial. This type can happen at any time of the year. This condition cannot be spread from person to person (is not contagious). It can be mild, bad, or very bad. It can develop at any age and may be outgrown. What are the causes? Pollen from grasses, trees, and weeds. Other causes can be: Dust mites. Smoke. Mold. Car fumes. The pee (urine), spit, or dander of pets. Dander is dead skin cells from a pet. What increases the risk? You are more likely to develop this condition if: You have allergies in your family. You have problems like allergies in your family. You may have: Swelling of parts of your eyes and eyelids. Asthma. This affects how you breathe. Long-term redness and swelling on your skin. Food allergies. What are the signs or symptoms? The main symptom of this condition is a runny or stuffy nose (nasal congestion). Other symptoms may include: Sneezing or coughing. Itching and tearing of your eyes. Mucus that drips down the back of your throat (postnasal drip). This may cause a sore throat. Trouble sleeping. Feeling tired. Headache. How is this treated? There is no cure for this condition. You should avoid things that you are allergic to. Treatment can help to relieve symptoms. This may include: Medicines that block allergy symptoms, such as anti-inflammatories or antihistamines. These may be given as a shot, nasal spray, or pill. Avoiding things you are allergic to. Medicines that give you some of what you are allergic to over time. This is called immunotherapy. It is done if other treatments do not help. You may get: Shots. Medicine under  your tongue. Stronger medicines, if other treatments do not help. Follow these instructions at home: Avoiding allergens Find out what things you are allergic to and avoid them. To do this, try these things: If you get allergies any time of year: Replace carpet with wood, tile, or vinyl flooring. Carpet can trap pet dander and dust. Do not smoke. Do not allow smoking in your home. Change your heating and air conditioning filters at least once a month. If you get allergies only some times of the year: Keep windows closed when you can. Plan things to do outside when pollen counts are lowest. Check pollen counts before you plan things to do outside. When you come indoors, change your clothes and shower before you sit on furniture or bedding. If you are allergic to a pet: Keep the pet out of your bedroom. Vacuum, sweep, and dust often. General instructions Take over-the-counter and prescription medicines only as told by your doctor. Drink enough fluid to keep your pee pale yellow. Where to find more information American Academy of Allergy, Asthma & Immunology: aaaai.org Contact a doctor if: You have a fever. You get a cough that does not go away. You make high-pitched whistling sounds when you breathe, most often when you breathe out (wheeze). Your symptoms slow you down. Your symptoms stop you from doing your normal things each day. Get help right away if: You are short of breath. This symptom may be an emergency. Get help right away. Call 911. Do not wait to see if the symptom will go away. Do not drive yourself to the  hospital. This information is not intended to replace advice given to you by your health care provider. Make sure you discuss any questions you have with your health care provider. Document Revised: 12/16/2021 Document Reviewed: 12/16/2021 Elsevier Patient Education  [employer].

## 2022-06-23 NOTE — Progress Notes (Signed)
Subjective:    Patient ID: David Guerrero, male    DOB: 05/31/1940, 82 y.o.   MRN: [id number]   Chief Complaint: Sinus Problem (Saw Sharyn Lull on 2/29)   Patient was seen on 06/19/22 with sinus issues. Was dx with seasonal allergies and was prescribed xyzal daily. He is better but still feels stopped up. He is using flonase daily  Sinus Problem This is a new problem. The current episode started in the past 7 days. The problem has been waxing and waning since onset. There has been no fever. Associated symptoms include congestion, coughing and headaches. Pertinent negatives include no sinus pressure or sore throat. Past treatments include acetaminophen. The treatment provided mild relief.    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 Negative for sinus pressure and sore throat.   Respiratory:  Positive for cough.   Neurological:  Positive for headaches.       Objective:   Physical Exam Vitals reviewed.  Constitutional:      Appearance: Normal appearance.  HENT:     Right Ear: Tympanic membrane normal.     Left Ear: Tympanic membrane normal.     Nose: Congestion and rhinorrhea present.     Mouth/Throat:     Pharynx: No oropharyngeal exudate or posterior oropharyngeal erythema.  Cardiovascular:     Rate and Rhythm: Normal rate and regular rhythm.     Heart sounds: Normal heart sounds.  Pulmonary:     Breath sounds: Normal breath sounds.   Skin:    General: Skin is warm.  Neurological:     General: No focal deficit present.     Mental Status: He is alert and oriented to person, place, and time.  Psychiatric:        Mood and Affect: Mood normal.        Behavior: Behavior normal.     BP (!) 146/81   Pulse 61   Temp 97.6 F (36.4 C) (Temporal)   Resp 20   Ht '5\' 10"'$  (1.778 m)   Wt 197 lb (89.4 kg)   SpO2 95%   BMI 28.27 kg/m        Assessment & Plan:  David Guerrero in today with chief complaint of Sinus Problem (Saw Michelle on 2/29)   1. Seasonal allergic rhinitis due to pollen Force fluids Continue flonase and xyzal as prescribed  Meds ordered this encounter  Medications   predniSONE (DELTASONE) 20 MG tablet    Sig: Take 2 tablets (40 mg total) by mouth daily with breakfast for 5 days. 2 po daily for 5 days    Dispense:  10 tablet    Refill:  0    Order Specific Question:   Supervising Provider    Answer:   Caryl Pina A [id number]       The above assessment and management plan was discussed with the patient. The patient verbalized understanding of and has agreed to the  management plan. Patient is aware to call the clinic if symptoms persist or worsen. Patient is aware when to return to the clinic for a follow-up visit. Patient educated on when it is appropriate to go to the emergency department.   Mary-Margaret Hassell Done, FNP

## 2022-07-09 ENCOUNTER — Ambulatory Visit (INDEPENDENT_AMBULATORY_CARE_PROVIDER_SITE_OTHER): Payer: Medicare Other

## 2022-07-09 VITALS — Ht 70.0 in | Wt 197.0 lb

## 2022-07-09 DIAGNOSIS — Z0000 Encounter for general adult medical examination without abnormal findings: Secondary | ICD-10-CM

## 2022-07-09 NOTE — Patient Instructions (Addendum)
[name] , Thank you for taking time to come for your Medicare Wellness Visit. I appreciate your ongoing commitment to your health goals. Please review the following plan we discussed and let me know if I can assist you in the future.   These are the goals we discussed:  Goals      DIET - EAT MORE FRUITS AND VEGETABLES     Eating less meat - more plant-base      Prevent falls     Stay active and healthy Spend time with family         This is a list of the screening recommended for you and due dates:  Health Maintenance  Topic Date Due   COVID-19 Vaccine (7 - 2023-24 season) 03/21/2022   Flu Shot  07/20/2022*   Medicare Annual Wellness Visit  07/09/2023   DTaP/Tdap/Td vaccine (2 - Tdap) 01/29/2027   Pneumonia Vaccine  Completed   Zoster (Shingles) Vaccine  Completed   HPV Vaccine  Aged Out  *Topic was postponed. The date shown is not the original due date.    Advanced directives: Please bring a copy of your health care power of attorney and living will to the office to be added to your chart at your convenience.   Conditions/risks identified: Aim for 30 minutes of exercise or brisk walking, 6-8 glasses of water, and 5 servings of fruits and vegetables each day.   Next appointment: Follow up in one year for your annual wellness visit.   Preventive Care 41 Years and Older, Male  Preventive care refers to lifestyle choices and visits with your health care provider that can promote health and wellness. What does preventive care include? A yearly physical exam. This is also called an annual well check. Dental exams once or twice a year. Routine eye exams. Ask your health care provider how often you should have your eyes checked. Personal lifestyle choices, including: Daily care of your teeth and gums. Regular physical activity. Eating a healthy diet. Avoiding tobacco and drug use. Limiting alcohol use. Practicing safe sex. Taking low doses of aspirin every day. Taking  vitamin and mineral supplements as recommended by your health care provider. What happens during an annual well check? The services and screenings done by your health care provider during your annual well check will depend on your age, overall health, lifestyle risk factors, and family history of disease. Counseling  Your health care provider may ask you questions about your: Alcohol use. Tobacco use. Drug use. Emotional well-being. Home and relationship well-being. Sexual activity. Eating habits. History of falls. Memory and ability to understand (cognition). Work and work Statistician. Screening  You may have the following tests or measurements: Height, weight, and BMI. Blood pressure. Lipid and cholesterol levels. These may be checked every 5 years, or more frequently if you are over 3 years old. Skin check. Lung cancer screening. You may have this screening every year starting at age 33 if you have a 30-pack-year history of smoking and currently smoke or have quit within the past 15 years. Fecal occult blood test (FOBT) of the stool. You may have this test every year starting at age 29. Flexible sigmoidoscopy or colonoscopy. You may have a sigmoidoscopy every 5 years or a colonoscopy every 10 years starting at age 58. Prostate cancer screening. Recommendations will vary depending on your family history and other risks. Hepatitis C blood test. Hepatitis B blood test. Sexually transmitted disease (STD) testing. Diabetes screening. This is done by checking  your blood sugar (glucose) after you have not eaten for a while (fasting). You may have this done every 1-3 years. Abdominal aortic aneurysm (AAA) screening. You may need this if you are a current or former smoker. Osteoporosis. You may be screened starting at age 37 if you are at high risk. Talk with your health care provider about your test results, treatment options, and if necessary, the need for more tests. Vaccines  Your  health care provider may recommend certain vaccines, such as: Influenza vaccine. This is recommended every year. Tetanus, diphtheria, and acellular pertussis (Tdap, Td) vaccine. You may need a Td booster every 10 years. Zoster vaccine. You may need this after age 56. Pneumococcal 13-valent conjugate (PCV13) vaccine. One dose is recommended after age 10. Pneumococcal polysaccharide (PPSV23) vaccine. One dose is recommended after age 15. Talk to your health care provider about which screenings and vaccines you need and how often you need them. This information is not intended to replace advice given to you by your health care provider. Make sure you discuss any questions you have with your health care provider. Document Released: 05/04/2015 Document Revised: 12/26/2015 Document Reviewed: 02/06/2015 Elsevier Interactive Patient Education  2017 [place] Prevention in the Home Falls can cause injuries. They can happen to people of all ages. There are many things you can do to make your home safe and to help prevent falls. What can I do on the outside of my home? Regularly fix the edges of walkways and driveways and fix any cracks. Remove anything that might make you trip as you walk through a door, such as a raised step or threshold. Trim any bushes or trees on the path to your home. Use bright outdoor lighting. Clear any walking paths of anything that might make someone trip, such as rocks or tools. Regularly check to see if handrails are loose or broken. Make sure that both sides of any steps have handrails. Any raised decks and porches should have guardrails on the edges. Have any leaves, snow, or ice cleared regularly. Use sand or salt on walking paths during winter. Clean up any spills in your garage right away. This includes oil or grease spills. What can I do in the bathroom? Use night lights. Install grab bars by the toilet and in the tub and shower. Do not use towel bars as  grab bars. Use non-skid mats or decals in the tub or shower. If you need to sit down in the shower, use a plastic, non-slip stool. Keep the floor dry. Clean up any water that spills on the floor as soon as it happens. Remove soap buildup in the tub or shower regularly. Attach bath mats securely with double-sided non-slip rug tape. Do not have throw rugs and other things on the floor that can make you trip. What can I do in the bedroom? Use night lights. Make sure that you have a light by your bed that is easy to reach. Do not use any sheets or blankets that are too big for your bed. They should not hang down onto the floor. Have a firm chair that has side arms. You can use this for support while you get dressed. Do not have throw rugs and other things on the floor that can make you trip. What can I do in the kitchen? Clean up any spills right away. Avoid walking on wet floors. Keep items that you use a lot in easy-to-reach places. If you need to reach something  above you, use a strong step stool that has a grab bar. Keep electrical cords out of the way. Do not use floor polish or wax that makes floors slippery. If you must use wax, use non-skid floor wax. Do not have throw rugs and other things on the floor that can make you trip. What can I do with my stairs? Do not leave any items on the stairs. Make sure that there are handrails on both sides of the stairs and use them. Fix handrails that are broken or loose. Make sure that handrails are as long as the stairways. Check any carpeting to make sure that it is firmly attached to the stairs. Fix any carpet that is loose or worn. Avoid having throw rugs at the top or bottom of the stairs. If you do have throw rugs, attach them to the floor with carpet tape. Make sure that you have a light switch at the top of the stairs and the bottom of the stairs. If you do not have them, ask someone to add them for you. What else can I do to help prevent  falls? Wear shoes that: Do not have high heels. Have rubber bottoms. Are comfortable and fit you well. Are closed at the toe. Do not wear sandals. If you use a stepladder: Make sure that it is fully opened. Do not climb a closed stepladder. Make sure that both sides of the stepladder are locked into place. Ask someone to hold it for you, if possible. Clearly mark and make sure that you can see: Any grab bars or handrails. First and last steps. Where the edge of each step is. Use tools that help you move around (mobility aids) if they are needed. These include: Canes. Walkers. Scooters. Crutches. Turn on the lights when you go into a dark area. Replace any light bulbs as soon as they burn out. Set up your furniture so you have a clear path. Avoid moving your furniture around. If any of your floors are uneven, fix them. If there are any pets around you, be aware of where they are. Review your medicines with your doctor. Some medicines can make you feel dizzy. This can increase your chance of falling. Ask your doctor what other things that you can do to help prevent falls. This information is not intended to replace advice given to you by your health care provider. Make sure you discuss any questions you have with your health care provider. Document Released: 02/01/2009 Document Revised: 09/13/2015 Document Reviewed: 05/12/2014 Elsevier Interactive Patient Education  2017 [employer].

## 2022-07-09 NOTE — Progress Notes (Signed)
Subjective:   David Guerrero is a 82 y.o. male who presents for Medicare Annual/Subsequent preventive examination.  I connected with  David Guerrero on 07/09/22 by a audio enabled telemedicine application and verified that I am speaking with the correct person using two identifiers.  Patient Location: Home  Provider Location: Home Office  I discussed the limitations of evaluation and management by telemedicine. The patient expressed understanding and agreed to proceed.  Review of Systems     Cardiac Risk Factors include: advanced age (>32men, >37 women);male gender;hypertension;dyslipidemia     Objective:    Today's Vitals   07/09/22 1452  Weight: 197 lb (89.4 kg)  Height: 5\' 10"  (1.778 m)   Body mass index is 28.27 kg/m.     07/09/2022    2:55 PM 06/07/2021    8:25 AM 06/06/2020    8:19 AM 11/26/2017    3:31 PM  Advanced Directives  Does Patient Have a Medical Advance Directive? Yes Yes Yes Yes  Type of Paramedic of [place];Living will [place];Living will [place];Living will Living will  Does patient want to make changes to medical advance directive? No - Patient declined  No - Patient declined No - Patient declined  Copy of [place] in Chart? No - copy requested No - copy requested No - copy requested     Current Medications (verified) Outpatient Encounter Medications as of 07/09/2022  Medication Sig   amLODipine (NORVASC) 5 MG tablet Take 1.5 tablets (7.5 mg total) by mouth daily.   atorvastatin (LIPITOR) 40 MG tablet Take 1 tablet (40 mg total) by mouth daily. For cholesterol   Cyanocobalamin (VITAMIN B 12 PO) Take by mouth.   fluticasone (FLONASE) 50 MCG/ACT nasal spray SPRAY 2 SPRAYS INTO EACH NOSTRIL EVERY DAY   levocetirizine (XYZAL) 5 MG tablet Take 1 tablet (5 mg total) by mouth every evening.   Multiple Vitamin (MULTIVITAMIN WITH MINERALS) TABS tablet Take 1 tablet by  mouth daily.   No facility-administered encounter medications on file as of 07/09/2022.    Allergies (verified) Patient has no known allergies.   History: Past Medical History:  Diagnosis Date   Cancer ([place])    melanoma, removed apprx 2013, early stage per pt   Cataract 12/14/2008   Hyperlipidemia    Memory deficit    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   Past Surgical History:  Procedure Laterality Date   EYE SURGERY  2010   Cataracts   MELANOMA EXCISION     L upper arm   Family History  Problem Relation Age of Onset   Arthritis Mother    Cancer Father        lung   Social History   Socioeconomic History   Marital status: Significant Other    Spouse name: Not on file   Number of children: 1   Years of education: Not on file   Highest education level: Bachelor's degree (e.g., BA, AB, BS)  Occupational History   Occupation: Retired   Occupation: Self Employed    Comment: [phone]  Tobacco Use   Smoking status: Former    Packs/day: 0.50    Years: 10.00    Additional pack years: 0.00    Total pack years: 5.00    Types: Cigarettes    Quit date: 12/10/1980    Years since quitting: 41.6   Smokeless tobacco: Never  Vaping Use   Vaping Use: Never used  Substance and Sexual Activity   Alcohol use: Yes    Alcohol/week: 2.0 - 3.0 standard drinks of alcohol    Types: 2 - 3 Glasses of wine per week   Drug use: No   Sexual activity: Not Currently  Other Topics Concern   Not on file  Social History Narrative   Lives alone   Social Determinants of Health   Financial Resource Strain: Low Risk  (07/09/2022)   Overall Financial Resource Strain (CARDIA)    Difficulty of Paying Living Expenses: Not hard at all  Food Insecurity: No Food Insecurity (07/09/2022)   Hunger Vital Sign    Worried About Running Out of Food in the Last Year: Never true    Ran Out of Food in the Last Year: Never true  Transportation Needs: No Transportation Needs (07/09/2022)   PRAPARE -  Hydrologist (Medical): No    Lack of Transportation (Non-Medical): No  Physical Activity: Sufficiently Active (07/09/2022)   Exercise Vital Sign    Days of Exercise per Week: 5 days    Minutes of Exercise per Session: 60 min  Stress: No Stress Concern Present (07/09/2022)   [place]    Feeling of Stress : Not at all  Social Connections: Moderately Integrated (07/09/2022)   Social Connection and Isolation Panel [NHANES]    Frequency of Communication with Friends and Family: More than three times a week    Frequency of Social Gatherings with Friends and Family: More than three times a week    Attends Religious Services: More than 4 times per year    Active Member of [employer] or Organizations: Yes    Attends Archivist Meetings: More than 4 times per year    Marital Status: Widowed    Tobacco Counseling Counseling given: Not Answered   Clinical Intake:  Pre-visit preparation completed: Yes  Pain : No/denies pain  Diabetes: No  How often do you need to have someone help you when you read instructions, pamphlets, or other written materials from your doctor or pharmacy?: 1 - Never  Diabetic?No   Interpreter Needed?: No  Information entered by :: Denman George LPN   Activities of Daily Living    07/09/2022    2:55 PM  In your present state of health, do you have any difficulty performing the following activities:  Hearing? 0  Vision? 0  Difficulty concentrating or making decisions? 0  Walking or climbing stairs? 0  Dressing or bathing? 0  Doing errands, shopping? 0  Preparing Food and eating ? N  Using the Toilet? N  In the past six months, have you accidently leaked urine? N  Do you have problems with loss of bowel control? N  Managing your Medications? N  Managing your Finances? N  Housekeeping or managing your Housekeeping? N    Patient Care Team: Janora Norlander, DO as PCP - General (Family Medicine) Loney Loh, MD (Dermatology) Celestia Khat, OD (Optometry)  Indicate any recent Medical Services you may have received from other than [facility] providers in the past year (date may be approximate).     Assessment:   This is a routine wellness examination for David Guerrero.  Hearing/Vision screen Hearing Screening - Comments:: Denies hearing difficulties  Vision Screening - Comments:: Wears rx glasses - up to date with routine eye exams    Dietary issues and exercise activities discussed: Current Exercise Habits: Home exercise routine, Type of exercise:  walking;strength training/weights, Time (Minutes): 60, Frequency (Times/Week): 5, Weekly Exercise (Minutes/Week): 300, Intensity: Mild   Goals Addressed   None   Depression Screen    07/09/2022    2:54 PM 06/23/2022    8:51 AM 04/28/2022    9:46 AM 01/20/2022    8:15 AM 08/21/2021    1:36 PM 07/10/2021    8:03 AM 06/07/2021    8:24 AM  PHQ 2/9 Scores  PHQ - 2 Score 0 0 0 0 0 0 0  PHQ- 9 Score  0   0      Fall Risk    07/09/2022    2:53 PM 06/23/2022    8:51 AM 04/28/2022    9:46 AM 01/20/2022    8:14 AM 08/21/2021    1:36 PM  Fall Risk   Falls in the past year? 0 0 0 0 1  Number falls in past yr: 0    0  Injury with Fall? 0    1  Risk for fall due to : No Fall Risks      Follow up Falls prevention discussed;Education provided;Falls evaluation completed    Falls prevention discussed    FALL RISK PREVENTION PERTAINING TO THE HOME:  Any stairs in or around the home? Yes  If so, are there any without handrails? No  Home free of loose throw rugs in walkways, pet beds, electrical cords, etc? Yes  Adequate lighting in your home to reduce risk of falls? Yes   ASSISTIVE DEVICES UTILIZED TO PREVENT FALLS:  Life alert? No  Use of a cane, walker or w/c? No  Grab bars in the bathroom? Yes  Shower chair or bench in shower? No  Elevated toilet seat or a handicapped toilet? Yes   TIMED UP AND  GO:  Was the test performed? No . Telephonic visit   Cognitive Function:    01/20/2022    8:30 AM 03/27/2020   12:45 PM 03/01/2020    8:53 AM 11/25/2019    2:08 PM 11/26/2017    3:32 PM  MMSE - Mini Mental State Exam  Orientation to time 5 5 5 4 5   Orientation to Place 5 5 5 5 5   Registration 3 3 3 3 3   Attention/ Calculation 4 4 5 5 5   Recall 2 2 3  0 3  Language- name 2 objects 1 2 2 2 2   Language- repeat 1 1 1 1 1   Language- follow 3 step command 3 3 3 3 3   Language- read & follow direction 1 1 1 1 1   Write a sentence 1 1 1 1 1   Copy design 1 1 1 1 1   Total score 27 28 30 26 30         07/09/2022    2:55 PM 06/06/2020    8:24 AM  6CIT Screen  What Year? 0 points 0 points  What month? 0 points 0 points  What time? 0 points 0 points  Count back from 20 0 points 0 points  Months in reverse 0 points 0 points  Repeat phrase 0 points 0 points  Total Score 0 points 0 points    Immunizations Immunization History  Administered Date(s) Administered   COVID-19, mRNA, vaccine(Comirnaty)12 years and older 01/24/2022   Influenza, High Dose Seasonal PF 01/13/2015, 12/20/2015, 01/06/2017, 12/20/2019, 01/11/2021   Influenza-Unspecified 01/06/2017, 12/03/2017, 12/09/2018   Moderna Covid-19 Vaccine Bivalent Booster 46yrs & up 01/22/2021   Moderna Sars-Covid-2 Vaccination 05/03/2019, 06/03/2019, 02/15/2020, 10/30/2020   Pneumococcal Conjugate-13 01/23/2015  Pneumococcal Polysaccharide-23 06/14/2007   Respiratory Syncytial Virus Vaccine,Recomb Aduvanted(Arexvy) 03/17/2022   Td 01/28/2017   Zoster Recombinat (Shingrix) 06/05/2020, 08/06/2020    TDAP status: Up to date  Flu Vaccine status: Up to date  Pneumococcal vaccine status: Up to date  Covid-19 vaccine status: Information provided on how to obtain vaccines.   Qualifies for Shingles Vaccine? Yes   Zostavax completed No   Shingrix Completed?: Yes  Screening Tests Health Maintenance  Topic Date Due   COVID-19 Vaccine (7 -  2023-24 season) 03/21/2022   INFLUENZA VACCINE  07/20/2022 (Originally 11/19/2021)   Medicare Annual Wellness (AWV)  07/09/2023   DTaP/Tdap/Td (2 - Tdap) 01/29/2027   Pneumonia Vaccine 15+ Years old  Completed   Zoster Vaccines- Shingrix  Completed   HPV VACCINES  Aged Out    Health Maintenance  Health Maintenance Due  Topic Date Due   COVID-19 Vaccine (7 - 2023-24 season) 03/21/2022    Colorectal cancer screening: No longer required.   Lung Cancer Screening: (Low Dose CT Chest recommended if Age 3-80 years, 30 pack-year currently smoking OR have quit w/in 15years.) does not qualify.   Lung Cancer Screening Referral: n/a   Additional Screening:  Hepatitis C Screening: does not qualify;   Vision Screening: Recommended annual ophthalmology exams for early detection of glaucoma and other disorders of the eye. Is the patient up to date with their annual eye exam?  Yes  Who is the provider or what is the name of the office in which the patient attends annual eye exams? Unable to provide  If pt is not established with a provider, would they like to be referred to a provider to establish care? No .   Dental Screening: Recommended annual dental exams for proper oral hygiene  Community Resource Referral / Chronic Care Management: CRR required this visit?  No   CCM required this visit?  No      Plan:     I have personally reviewed and noted the following in the patient's chart:   Medical and social history Use of alcohol, tobacco or illicit drugs  Current medications and supplements including opioid prescriptions. Patient is not currently taking opioid prescriptions. Functional ability and status Nutritional status Physical activity Advanced directives List of other physicians Hospitalizations, surgeries, and ER visits in previous 12 months Vitals Screenings to include cognitive, depression, and falls Referrals and appointments  In addition, I have reviewed and  discussed with patient certain preventive protocols, quality metrics, and best practice recommendations. A written personalized care plan for preventive services as well as general preventive health recommendations were provided to patient.     Vanetta Mulders, [place]   [id number]   Due to this being a virtual visit, the after visit summary with patients personalized plan was offered to patient via mail or my-chart.  Patient would like to access on my-chart  Nurse Notes: No concerns; states that blood pressure medication seems to be working well

## 2022-09-17 ENCOUNTER — Other Ambulatory Visit: Payer: Self-pay | Admitting: Family Medicine

## 2022-09-17 DIAGNOSIS — J301 Allergic rhinitis due to pollen: Secondary | ICD-10-CM

## 2022-09-22 ENCOUNTER — Telehealth: Payer: Self-pay | Admitting: Family Medicine

## 2022-09-22 NOTE — Telephone Encounter (Signed)
Patient expressed to his daughter that he would like to switch from [place] to [place] because he would be more comfortable with a male doctor.   She would also like for the patient to come in to see Selina Cooley to discuss weight loss and memory issues. Per daughter, patient is a vegetarian. Asked about getting an appointment this week, nothing that I could add him to for either provider at this time. Please call back and advise.

## 2022-09-22 NOTE — Telephone Encounter (Signed)
I can take over if that is what they want, but schedule for first available, not as a work in.

## 2022-09-22 NOTE — Telephone Encounter (Signed)
I'm ok with this

## 2022-09-25 ENCOUNTER — Encounter: Payer: Self-pay | Admitting: Family Medicine

## 2022-09-25 ENCOUNTER — Ambulatory Visit (INDEPENDENT_AMBULATORY_CARE_PROVIDER_SITE_OTHER): Payer: Medicare Other | Admitting: Family Medicine

## 2022-09-25 VITALS — BP 157/62 | HR 62 | Temp 97.5°F | Ht 70.0 in | Wt 190.4 lb

## 2022-09-25 DIAGNOSIS — G301 Alzheimer's disease with late onset: Secondary | ICD-10-CM

## 2022-09-25 DIAGNOSIS — F02A0 Dementia in other diseases classified elsewhere, mild, without behavioral disturbance, psychotic disturbance, mood disturbance, and anxiety: Secondary | ICD-10-CM | POA: Diagnosis not present

## 2022-09-25 MED ORDER — DONEPEZIL HCL 5 MG PO TABS: 5.0000 mg | ORAL_TABLET | Freq: Every day | ORAL | 1 refills | Status: DC

## 2022-09-25 NOTE — Progress Notes (Signed)
Subjective:  Patient ID: David Guerrero, male    DOB: [birth year]  Age: 82 y.o. MRN: [id number]  CC: Weight Loss   HPI [name] presents for bouts of aphasia or word salad. Occurring daily. Forgets words.      09/27/2022   11:26 PM 01/20/2022    8:30 AM 03/27/2020   12:45 PM  MMSE - Mini Mental State Exam  Orientation to time 5 5 5   Orientation to Place 4 5 5   Registration 1 3 3   Attention/ Calculation 4 4 4   Recall 0 2 2  Language- name 2 objects 2 1 2   Language- repeat 1 1 1   Language- follow 3 step command 3 3 3   Language- read & follow direction 1 1 1   Write a sentence 1 1 1   Copy design 1 1 1   Total score 23 27 28         09/25/2022    4:02 PM 07/09/2022    2:54 PM 06/23/2022    8:51 AM  Depression screen PHQ 2/9  Decreased Interest 0 0 0  Down, Depressed, Hopeless 0 0 0  PHQ - 2 Score 0 0 0  Altered sleeping   0  Tired, decreased energy   0  Change in appetite   0  Feeling bad or failure about yourself    0  Trouble concentrating   0  Moving slowly or fidgety/restless   0  Suicidal thoughts   0  PHQ-9 Score   0  Difficult doing work/chores   Not difficult at all    History David Guerrero has a past medical history of Cancer [facility]), Cataract (12/14/2008), Hyperlipidemia, and Memory deficit.   He has a past surgical history that includes Eye surgery (2010) and Melanoma excision.   His family history includes Arthritis in his mother; Cancer in his father.He reports that he quit smoking about 41 years ago. His smoking use included cigarettes. He has a 5.00 pack-year smoking history. He has never used smokeless tobacco. He reports current alcohol use of about 2.0 - 3.0 standard drinks of alcohol per week. He reports that he does not use drugs.    ROS Review of Systems  Constitutional:  Negative for fever.  Respiratory:  Negative for shortness of breath.   Cardiovascular:  Negative for chest pain.  Musculoskeletal:  Negative for arthralgias.  Skin:  Negative  for rash.    Objective:  BP (!) 157/62   Pulse 62   Temp (!) 97.5 F (36.4 C)   Ht 5\' 10"  (1.778 m)   Wt 190 lb 6.4 oz (86.4 kg)   SpO2 95%   BMI 27.32 kg/m   BP Readings from Last 3 Encounters:  09/25/22 (!) 157/62  06/23/22 (!) 146/81  06/19/22 (!) 159/76    Wt Readings from Last 3 Encounters:  09/25/22 190 lb 6.4 oz (86.4 kg)  07/09/22 197 lb (89.4 kg)  06/23/22 197 lb (89.4 kg)     Physical Exam Vitals reviewed.  Constitutional:      Appearance: He is well-developed.  HENT:     Head: Normocephalic and atraumatic.     Right Ear: External ear normal.     Left Ear: External ear normal.     Mouth/Throat:     Pharynx: No oropharyngeal exudate or posterior oropharyngeal erythema.  Eyes:     Pupils: Pupils are equal, round, and reactive to light.  Cardiovascular:     Rate and Rhythm: Normal rate and regular rhythm.  Heart sounds: No murmur heard. Pulmonary:     Effort: No respiratory distress.     Breath sounds: Normal breath sounds.  Musculoskeletal:     Cervical back: Normal range of motion and neck supple.  Neurological:     Mental Status: He is alert and oriented to person, place, and time.       Assessment & Plan:   Lashun was seen today for weight loss.  Diagnoses and all orders for this visit:  Mild late onset Alzheimer's dementia without behavioral disturbance, psychotic disturbance, mood disturbance, or anxiety (HCC) -     CMP14+EGFR -     Prealbumin  Other orders -     donepezil (ARICEPT) 5 MG tablet; Take 1 tablet (5 mg total) by mouth at bedtime.       I am having Sherilyn Cooter C. Mascari Guerrero start on donepezil. I am also having him maintain his Cyanocobalamin (VITAMIN B 12 PO), multivitamin with minerals, atorvastatin, amLODipine, levocetirizine, and fluticasone.  Allergies as of 09/25/2022   No Known Allergies      Medication List        Accurate as of September 25, 2022 11:59 PM. If you have any questions, ask your nurse or doctor.           amLODipine 5 MG tablet Commonly known as: NORVASC Take 1.5 tablets (7.5 mg total) by mouth daily.   atorvastatin 40 MG tablet Commonly known as: LIPITOR Take 1 tablet (40 mg total) by mouth daily. For cholesterol   donepezil 5 MG tablet Commonly known as: Aricept Take 1 tablet (5 mg total) by mouth at bedtime. Started by: Mechele Claude, MD   fluticasone 50 MCG/ACT nasal spray Commonly known as: FLONASE SPRAY 2 SPRAYS INTO EACH NOSTRIL EVERY DAY   levocetirizine 5 MG tablet Commonly known as: XYZAL Take 1 tablet (5 mg total) by mouth every evening.   multivitamin with minerals Tabs tablet Take 1 tablet by mouth daily.   VITAMIN B 12 PO Take by mouth.         Follow-up: Return in about 1 month (around 10/25/2022).  Mechele Claude, M.D.

## 2022-09-26 ENCOUNTER — Other Ambulatory Visit: Payer: Medicare Other

## 2022-09-26 DIAGNOSIS — G301 Alzheimer's disease with late onset: Secondary | ICD-10-CM | POA: Diagnosis not present

## 2022-09-26 DIAGNOSIS — F02A0 Dementia in other diseases classified elsewhere, mild, without behavioral disturbance, psychotic disturbance, mood disturbance, and anxiety: Secondary | ICD-10-CM | POA: Diagnosis not present

## 2022-09-27 ENCOUNTER — Encounter: Payer: Self-pay | Admitting: Family Medicine

## 2022-09-27 LAB — CMP14+EGFR
ALT: 18 IU/L (ref 0–44)
AST: 23 IU/L (ref 0–40)
Albumin/Globulin Ratio: 2.1 (ref 1.2–2.2)
Albumin: 4.1 g/dL (ref 3.7–4.7)
Alkaline Phosphatase: 63 IU/L (ref 44–121)
BUN/Creatinine Ratio: 9 — ABNORMAL LOW (ref 10–24)
BUN: 9 mg/dL (ref 8–27)
Bilirubin Total: 0.7 mg/dL (ref 0.0–1.2)
CO2: 20 mmol/L (ref 20–29)
Calcium: 9.3 mg/dL (ref 8.6–10.2)
Chloride: 108 mmol/L — ABNORMAL HIGH (ref 96–106)
Creatinine, Ser: 1 mg/dL (ref 0.76–1.27)
Globulin, Total: 2 g/dL (ref 1.5–4.5)
Glucose: 107 mg/dL — ABNORMAL HIGH (ref 70–99)
Potassium: 3.8 mmol/L (ref 3.5–5.2)
Sodium: 143 mmol/L (ref 134–144)
Total Protein: 6.1 g/dL (ref 6.0–8.5)
eGFR: 75 mL/min/1.73 (ref >59)

## 2022-09-27 LAB — PREALBUMIN: PREALBUMIN: 22 mg/dL (ref 9–32)

## 2022-09-28 NOTE — Progress Notes (Signed)
Hello Caston,  Your lab result is normal and/or stable.Some minor variations that are not significant are commonly marked abnormal, but do not represent any medical problem for you.  Best regards, Demesha Boorman, M.D.

## 2022-09-29 ENCOUNTER — Telehealth: Payer: Self-pay | Admitting: Family Medicine

## 2022-09-29 ENCOUNTER — Other Ambulatory Visit: Payer: Self-pay | Admitting: Family Medicine

## 2022-09-29 DIAGNOSIS — F02A0 Dementia in other diseases classified elsewhere, mild, without behavioral disturbance, psychotic disturbance, mood disturbance, and anxiety: Secondary | ICD-10-CM

## 2022-09-29 NOTE — Telephone Encounter (Signed)
Referral placed, as requested [facility] 

## 2022-09-29 NOTE — Telephone Encounter (Signed)
REFERRAL REQUEST Telephone Note  Have you been seen at our office for this problem? 09/25/2022 (Advise that they may need an appointment with their PCP before a referral can be done)  Reason for Referral: neurology Referral discussed with patient: yes 09/25/2022  Best contact number of patient for referral team: [phone]    Has patient been seen by a specialist for this issue before: no  Patient provider preference for referral: [facility] Patient location preference for referral: [facility] or if Dr Darlyn Read recommends elsewhere   Patient notified that referrals can take up to a week or longer to process. If they haven't heard anything within a week they should call back and speak with the referral department.

## 2022-10-17 ENCOUNTER — Other Ambulatory Visit: Payer: Self-pay | Admitting: Family Medicine

## 2022-10-27 ENCOUNTER — Other Ambulatory Visit: Payer: Self-pay | Admitting: Family Medicine

## 2022-10-27 DIAGNOSIS — L570 Actinic keratosis: Secondary | ICD-10-CM | POA: Diagnosis not present

## 2022-10-27 DIAGNOSIS — Z85828 Personal history of other malignant neoplasm of skin: Secondary | ICD-10-CM | POA: Diagnosis not present

## 2022-10-27 DIAGNOSIS — L814 Other melanin hyperpigmentation: Secondary | ICD-10-CM | POA: Diagnosis not present

## 2022-10-27 DIAGNOSIS — D229 Melanocytic nevi, unspecified: Secondary | ICD-10-CM | POA: Diagnosis not present

## 2022-10-27 DIAGNOSIS — Z1283 Encounter for screening for malignant neoplasm of skin: Secondary | ICD-10-CM | POA: Diagnosis not present

## 2022-10-27 DIAGNOSIS — Z86006 Personal history of melanoma in-situ: Secondary | ICD-10-CM | POA: Diagnosis not present

## 2022-10-27 DIAGNOSIS — J301 Allergic rhinitis due to pollen: Secondary | ICD-10-CM

## 2022-10-27 DIAGNOSIS — L821 Other seborrheic keratosis: Secondary | ICD-10-CM | POA: Diagnosis not present

## 2022-10-29 ENCOUNTER — Ambulatory Visit: Payer: Medicare Other | Admitting: Family Medicine

## 2022-12-02 ENCOUNTER — Encounter: Payer: Self-pay | Admitting: *Deleted

## 2022-12-23 DIAGNOSIS — Z23 Encounter for immunization: Secondary | ICD-10-CM | POA: Diagnosis not present

## 2022-12-29 DIAGNOSIS — Z23 Encounter for immunization: Secondary | ICD-10-CM | POA: Diagnosis not present

## 2022-12-30 ENCOUNTER — Ambulatory Visit: Payer: Medicare Other | Admitting: Family Medicine

## 2023-01-06 ENCOUNTER — Encounter: Payer: Self-pay | Admitting: Diagnostic Neuroimaging

## 2023-01-06 ENCOUNTER — Ambulatory Visit (INDEPENDENT_AMBULATORY_CARE_PROVIDER_SITE_OTHER): Payer: Medicare Other | Admitting: Diagnostic Neuroimaging

## 2023-01-06 VITALS — BP 151/64 | HR 64 | Ht 70.5 in | Wt 182.0 lb

## 2023-01-06 DIAGNOSIS — R413 Other amnesia: Secondary | ICD-10-CM | POA: Diagnosis not present

## 2023-01-06 NOTE — Patient Instructions (Signed)
MILD MEMORY LOSS (mild dementia) - consider increase donepezil 10mg  at bedtime - consider memantine 10mg  at bedtime; increase to twice a day after 1-2 weeks - safety / supervision issues reviewed - daily physical activity / exercise (at least 15-30 minutes) - eat more plants / vegetables - increase social activities, brain stimulation, games, puzzles, hobbies, crafts, arts, music - aim for at least 7-8 hours sleep per night (or more) - avoid smoking and alcohol - caution with medications, finances, driving, living alone - continue B12 replacement

## 2023-01-06 NOTE — Progress Notes (Signed)
[facility]  PATIENT: David Guerrero DOB: [birth year]  REFERRING CLINICIAN: Mechele Claude, MD HISTORY FROM: patient and daughter REASON FOR VISIT: follow up   HISTORICAL  CHIEF COMPLAINT:  Chief Complaint  Patient presents with   New Established Patient     Rm 6, daughter jennifer  Pt is here for memory. Pt's daughter states pt's memory has been declining within the past 2 years. States pt has been taking donepezil for 6 weeks now. States pt is very independent and lives alone. Pt states he has noticed a difference while using the donepezil for the better.     HISTORY OF PRESENT ILLNESS:   UPDATE (01/06/23, VRP): Since last visit, more progression of memory loss. More diff with computer (had accidentally entered credit card info into a scam site). Still driving. Still living alone in 4 bd home. Planning to move to friends home when opening is available (but wait list is 1-3 years). Daughter lives in [place].   PRIOR HPI (03/27/20): 82 year old male here for evaluation of memory loss.  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  Then patient's wife was diagnosed with cancer and passed away in Apr 02, 2018.  Around that time he was having significant stress and had forgotten to pay property tax and register the cars.  He was having some trouble with short-term memory and conversations.  Patient followed up with PCP and had B12 testing which was slightly low and started on replacement in the last few months, now patient feeling better.  Patient lives alone.  He takes care of all of his ADLs.  He walks several times a week.  He is avid in exercise and activity.  I spoke with his daughter via phone and reviewed his test results and information.  She has noticed some similar mild memory issues over the past 1 to 2 years.   REVIEW OF SYSTEMS: Full 14  system review of systems performed and negative with exception of: As per HPI.  ALLERGIES: No Known Allergies  HOME MEDICATIONS: Outpatient Medications Prior to Visit  Medication Sig Dispense Refill   amLODipine (NORVASC) 5 MG tablet Take 1.5 tablets (7.5 mg total) by mouth daily. 135 tablet 3   atorvastatin (LIPITOR) 40 MG tablet Take 1 tablet (40 mg total) by mouth daily. For cholesterol 90 tablet 3   Cyanocobalamin (VITAMIN B 12 PO) Take by mouth.     donepezil (ARICEPT) 5 MG tablet TAKE 1 TABLET BY MOUTH EVERYDAY AT BEDTIME 90 tablet 0   fluticasone (FLONASE) 50 MCG/ACT nasal spray SPRAY 2 SPRAYS INTO EACH NOSTRIL EVERY DAY 48 mL 1   levocetirizine (XYZAL) 5 MG tablet TAKE 1 TABLET BY MOUTH EVERY DAY IN THE EVENING 90 tablet 1   Multiple Vitamin (MULTIVITAMIN WITH MINERALS) TABS tablet Take 1 tablet by mouth daily.     OVER THE COUNTER MEDICATION Cream for arthritis pain     No facility-administered medications prior to visit.    PAST MEDICAL HISTORY: Past Medical History:  Diagnosis Date   Cancer (HCC)    melanoma, removed apprx 04-02-12, early stage per pt   Cataract 12/14/2008   Hyperlipidemia    Memory deficit    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    PAST SURGICAL HISTORY: Past Surgical History:  Procedure Laterality Date   EYE SURGERY  2009/04/02   Cataracts   MELANOMA EXCISION  L upper arm    FAMILY HISTORY: Family History  Problem Relation Age of Onset   Arthritis Mother    Cancer Father        lung    SOCIAL HISTORY: Social History   Socioeconomic History   Marital status: Significant Other    Spouse name: Not on file   Number of children: 1   Years of education: Not on file   Highest education level: Bachelor's degree (e.g., BA, AB, BS)  Occupational History   Occupation: Retired   Occupation: Self Employed    Comment: [phone]  Tobacco Use   Smoking status: Former    Current packs/day: 0.00    Average packs/day: 0.5 packs/day for 10.0 years (5.0 ttl  pk-yrs)    Types: Cigarettes    Start date: 12/11/1970    Quit date: 12/10/1980    Years since quitting: 42.1   Smokeless tobacco: Never  Vaping Use   Vaping status: Never Used  Substance and Sexual Activity   Alcohol use: Yes    Alcohol/week: 2.0 - 3.0 standard drinks of alcohol    Types: 2 - 3 Glasses of wine per week   Drug use: No   Sexual activity: Not Currently  Other Topics Concern   Not on file  Social History Narrative   Lives alone   Social Determinants of Health   Financial Resource Strain: Low Risk  (07/09/2022)   Overall Financial Resource Strain (CARDIA)    Difficulty of Paying Living Expenses: Not hard at all  Food Insecurity: No Food Insecurity (07/09/2022)   Hunger Vital Sign    Worried About Running Out of Food in the Last Year: Never true    Ran Out of Food in the Last Year: Never true  Transportation Needs: No Transportation Needs (07/09/2022)   PRAPARE - Administrator, Civil Service (Medical): No    Lack of Transportation (Non-Medical): No  Physical Activity: Sufficiently Active (07/09/2022)   Exercise Vital Sign    Days of Exercise per Week: 5 days    Minutes of Exercise per Session: 60 min  Stress: No Stress Concern Present (07/09/2022)   [employer] of Occupational Health - Occupational Stress Questionnaire    Feeling of Stress : Not at all  Social Connections: Moderately Integrated (07/09/2022)   Social Connection and Isolation Panel [NHANES]    Frequency of Communication with Friends and Family: More than three times a week    Frequency of Social Gatherings with Friends and Family: More than three times a week    Attends Religious Services: More than 4 times per year    Active Member of [employer] or Organizations: Yes    Attends Banker Meetings: More than 4 times per year    Marital Status: Widowed  Intimate Partner Violence: Not At Risk (07/09/2022)   Humiliation, Afraid, Rape, and Kick questionnaire    Fear of Current or  Ex-Partner: No    Emotionally Abused: No    Physically Abused: No    Sexually Abused: No     PHYSICAL EXAM  GENERAL EXAM/CONSTITUTIONAL: Vitals:  Vitals:   01/06/23 1147  BP: (!) 151/64  Pulse: 64  Weight: 182 lb (82.6 kg)  Height: 5' 10.5" (1.791 m)   Body mass index is 25.75 kg/m. Wt Readings from Last 3 Encounters:  01/06/23 182 lb (82.6 kg)  09/25/22 190 lb 6.4 oz (86.4 kg)  07/09/22 197 lb (89.4 kg)   Patient is in no distress; well developed, nourished  and groomed; neck is supple  CARDIOVASCULAR: Examination of carotid arteries is normal; no carotid bruits Regular rate and rhythm, no murmurs Examination of peripheral vascular system by observation and palpation is normal  EYES: Ophthalmoscopic exam of optic discs and posterior segments is normal; no papilledema or hemorrhages No results found.  MUSCULOSKELETAL: Gait, strength, tone, movements noted in Neurologic exam below  NEUROLOGIC: MENTAL STATUS:     01/06/2023   11:58 AM 09/27/2022   11:26 PM 01/20/2022    8:30 AM  MMSE - Mini Mental State Exam  Orientation to time 3 5 5   Orientation to Place 4 4 5   Registration 3 1 3   Attention/ Calculation 4 4 4   Recall 1 0 2  Language- name 2 objects 2 2 1   Language- repeat 1 1 1   Language- follow 3 step command 3 3 3   Language- read & follow direction 1 1 1   Write a sentence 1 1 1   Copy design 1 1 1   Total score 24 23 27    awake, alert, oriented to person, place and time recent and remote memory intact normal attention and concentration language fluent, comprehension intact, naming intact fund of knowledge appropriate  CRANIAL NERVE:  2nd - no papilledema on fundoscopic exam 2nd, 3rd, 4th, 6th - pupils equal and reactive to light, visual fields full to confrontation, extraocular muscles intact, no nystagmus 5th - facial sensation symmetric 7th - facial strength symmetric 8th - hearing intact 9th - palate elevates symmetrically, uvula midline 11th -  shoulder shrug symmetric 12th - tongue protrusion midline  MOTOR:  normal bulk and tone, full strength in the BUE, BLE  SENSORY:  normal and symmetric to light touch, temperature, vibration  COORDINATION:  finger-nose-finger, fine finger movements normal  REFLEXES:  deep tendon reflexes TRACE and symmetric  GAIT/STATION:  narrow based gait     DIAGNOSTIC DATA (LABS, IMAGING, TESTING) - I reviewed patient records, labs, notes, testing and imaging myself where available.  Lab Results  Component Value Date   WBC 5.0 01/20/2022   HGB 15.3 01/20/2022   HCT 45.6 01/20/2022   MCV 95 01/20/2022   PLT 157 01/20/2022      Component Value Date/Time   NA 143 09/26/2022 0805   K 3.8 09/26/2022 0805   CL 108 (H) 09/26/2022 0805   CO2 20 09/26/2022 0805   GLUCOSE 107 (H) 09/26/2022 0805   GLUCOSE 95 10/19/2008 1022   BUN 9 09/26/2022 0805   CREATININE 1.00 09/26/2022 0805   CALCIUM 9.3 09/26/2022 0805   PROT 6.1 09/26/2022 0805   ALBUMIN 4.1 09/26/2022 0805   AST 23 09/26/2022 0805   ALT 18 09/26/2022 0805   ALKPHOS 63 09/26/2022 0805   BILITOT 0.7 09/26/2022 0805   GFRNONAA 80 11/28/2019 0805   GFRAA 92 11/28/2019 0805   Lab Results  Component Value Date   CHOL 159 01/20/2022   HDL 61 01/20/2022   LDLCALC 78 01/20/2022   LDLDIRECT 65 07/16/2020   TRIG 112 01/20/2022   CHOLHDL 2.6 01/20/2022   No results found for: "HGBA1C" Lab Results  Component Value Date   VITAMINB12 702 01/20/2022   Vitamin B-12  Date Value Ref Range Status  01/20/2022 702 232 - 1,245 pg/mL Final  07/16/2020 833 232 - 1,245 pg/mL Final  03/01/2020 728 232 - 1,245 pg/mL Final    Lab Results  Component Value Date   TSH 2.360 01/20/2022    12/29/19 MRI brain [I reviewed images myself and agree with interpretation. -VRP]  -  No acute intracranial process. Remote left parietal microhemorrhage. -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 changes.    ASSESSMENT AND  PLAN  82 y.o. year old male here with:  Dx:  1. Memory loss     PLAN:  MILD MEMORY LOSS (mild dementia) - check ATN labs - consider increase donepezil 10mg  at bedtime - consider memantine 10mg  at bedtime; increase to twice a day after 1-2 weeks - safety / supervision issues reviewed - daily physical activity / exercise (at least 15-30 minutes) - eat more plants / vegetables - increase social activities, brain stimulation, games, puzzles, hobbies, crafts, arts, music - aim for at least 7-8 hours sleep per night (or more) - avoid smoking and alcohol - caution with medications, finances, driving, living alone - continue B12 replacement  Orders Placed This Encounter  Procedures   ATN PROFILE   Return for pending if symptoms worsen or fail to improve, return to PCP, pending test results.    Suanne Marker, MD 01/06/2023, 12:17 PM Certified in Neurology, Neurophysiology and Neuroimaging  [facility] [address] [place], [place] [address] [phone]

## 2023-01-09 LAB — ATN PROFILE
A -- Beta-amyloid 42/40 Ratio: 0.099 — ABNORMAL LOW (ref >0.102)
Beta-amyloid 40: 197.89 pg/mL
Beta-amyloid 42: 19.54 pg/mL
N -- NfL, Plasma: 4.66 pg/mL (ref 0.00–11.55)
T -- p-tau181: 1.34 pg/mL — ABNORMAL HIGH (ref 0.00–0.97)

## 2023-01-15 ENCOUNTER — Ambulatory Visit: Payer: Medicare Other | Admitting: Family Medicine

## 2023-01-19 ENCOUNTER — Ambulatory Visit (INDEPENDENT_AMBULATORY_CARE_PROVIDER_SITE_OTHER): Payer: Medicare Other | Admitting: Family Medicine

## 2023-01-19 ENCOUNTER — Encounter: Payer: Self-pay | Admitting: Family Medicine

## 2023-01-19 ENCOUNTER — Encounter: Payer: Self-pay | Admitting: Neurology

## 2023-01-19 VITALS — BP 116/59 | HR 51 | Temp 97.6°F | Ht 70.5 in | Wt 179.4 lb

## 2023-01-19 DIAGNOSIS — G301 Alzheimer's disease with late onset: Secondary | ICD-10-CM

## 2023-01-19 DIAGNOSIS — F02A0 Dementia in other diseases classified elsewhere, mild, without behavioral disturbance, psychotic disturbance, mood disturbance, and anxiety: Secondary | ICD-10-CM

## 2023-01-19 DIAGNOSIS — I10 Essential (primary) hypertension: Secondary | ICD-10-CM

## 2023-01-19 DIAGNOSIS — E782 Mixed hyperlipidemia: Secondary | ICD-10-CM

## 2023-01-19 LAB — URINALYSIS
Bilirubin, UA: NEGATIVE
Glucose, UA: NEGATIVE
Ketones, UA: NEGATIVE
Leukocytes,UA: NEGATIVE
Nitrite, UA: NEGATIVE
Protein,UA: NEGATIVE
Specific Gravity, UA: 1.015 (ref 1.005–1.030)
Urobilinogen, Ur: 0.2 mg/dL (ref 0.2–1.0)
pH, UA: 6 (ref 5.0–7.5)

## 2023-01-19 MED ORDER — ATORVASTATIN CALCIUM 40 MG PO TABS
40.0000 mg | ORAL_TABLET | Freq: Every day | ORAL | 3 refills | Status: DC
Start: 2023-01-19 — End: 2023-06-22

## 2023-01-19 MED ORDER — DONEPEZIL HCL 5 MG PO TABS: 5.0000 mg | ORAL_TABLET | Freq: Every day | ORAL | 3 refills | Status: DC

## 2023-01-19 NOTE — Progress Notes (Signed)
Subjective:  Patient ID: David Guerrero, male    DOB: 07/29/1940  Age: 82 y.o. MRN: [id number]  CC: Annual Exam   HPI David Guerrero presents for  presents for  follow-up of hypertension. Patient has no history of headache chest pain or shortness of breath or recent cough. Patient also denies symptoms of TIA such as focal numbness or weakness. Patient denies side effects from medication. States taking it regularly.  Exercising, sleeping well. Walking, lifting light weights. Saw neurology. I reviewed his Alzheimer's panel. Showed evidence for the disease.     01/19/2023   10:10 AM 09/25/2022    4:02 PM 07/09/2022    2:54 PM  Depression screen PHQ 2/9  Decreased Interest 0 0 0  Down, Depressed, Hopeless 0 0 0  PHQ - 2 Score 0 0 0    History David Guerrero has a past medical history of Cancer [facility]), Cataract (12/14/2008), Hyperlipidemia, and Memory deficit.   He has a past surgical history that includes Eye surgery (2010) and Melanoma excision.   His family history includes Arthritis in his mother; Cancer in his father.He reports that he quit smoking about 42 years ago. His smoking use included cigarettes. He started smoking about 52 years ago. He has a 5 pack-year smoking history. He has never used smokeless tobacco. He reports current alcohol use of about 2.0 - 3.0 standard drinks of alcohol per week. He reports that he does not use drugs.    ROS Review of Systems  Constitutional:  Negative for activity change, fatigue and unexpected weight change.  HENT:  Negative for congestion, ear pain, hearing loss, postnasal drip and trouble swallowing.   Eyes:  Negative for pain and visual disturbance.  Respiratory:  Negative for cough, chest tightness and shortness of breath.   Cardiovascular:  Negative for chest pain, palpitations and leg swelling.  Gastrointestinal:  Negative for abdominal distention, abdominal pain, blood in stool, constipation, diarrhea, nausea and vomiting.  Endocrine:  Negative for cold intolerance, heat intolerance and polydipsia.  Genitourinary:  Negative for difficulty urinating, dysuria, flank pain, frequency and urgency.  Musculoskeletal:  Positive for arthralgias (arthritis pain in hands). Negative for joint swelling.  Skin:  Negative for color change, rash and wound.  Neurological:  Negative for dizziness, syncope, speech difficulty, weakness, light-headedness, numbness and headaches.  Hematological:  Does not bruise/bleed easily.  Psychiatric/Behavioral:  Negative for confusion, decreased concentration, dysphoric mood and sleep disturbance. The patient is not nervous/anxious.     Objective:  BP (!) 116/59   Pulse (!) 51   Temp 97.6 F (36.4 C)   Ht 5' 10.5" (1.791 m)   Wt 179 lb 6.4 oz (81.4 kg)   SpO2 96%   BMI 25.38 kg/m   BP Readings from Last 3 Encounters:  01/19/23 (!) 116/59  01/06/23 (!) 151/64  09/25/22 (!) 157/62    Wt Readings from Last 3 Encounters:  01/19/23 179 lb 6.4 oz (81.4 kg)  01/06/23 182 lb (82.6 kg)  09/25/22 190 lb 6.4 oz (86.4 kg)     Physical Exam Vitals reviewed.  Constitutional:      Appearance: He is well-developed.  HENT:     Head: Normocephalic and atraumatic.     Right Ear: External ear normal.     Left Ear: External ear normal.     Mouth/Throat:     Pharynx: No oropharyngeal exudate or posterior oropharyngeal erythema.  Eyes:     Pupils: Pupils are equal, round, and reactive to light.  Cardiovascular:     Rate and Rhythm: Normal rate and regular rhythm.     Heart sounds: No murmur heard. Pulmonary:     Effort: No respiratory distress.     Breath sounds: Normal breath sounds.  Musculoskeletal:     Cervical back: Normal range of motion and neck supple.  Neurological:     Mental Status: He is alert and oriented to person, place, and time.     Coordination: Coordination normal.  Psychiatric:        Mood and Affect: Mood normal.        Behavior: Behavior normal.       Assessment &  Plan:   David Guerrero was seen today for annual exam.  Diagnoses and all orders for this visit:  Mild late onset Alzheimer's dementia without behavioral disturbance, psychotic disturbance, mood disturbance, or anxiety (HCC)  Essential hypertension -     CBC with Differential/Platelet -     CMP14+EGFR -     Urinalysis  Mixed hyperlipidemia -     Lipid panel -     atorvastatin (LIPITOR) 40 MG tablet; Take 1 tablet (40 mg total) by mouth daily. For cholesterol  Other orders -     donepezil (ARICEPT) 5 MG tablet; Take 1 tablet (5 mg total) by mouth at bedtime.       I have changed David Guerrero's donepezil. I am also having him maintain his Cyanocobalamin (VITAMIN B 12 PO), multivitamin with minerals, amLODipine, fluticasone, levocetirizine, OVER THE COUNTER MEDICATION, and atorvastatin.  Allergies as of 01/19/2023   No Known Allergies      Medication List        Accurate as of January 19, 2023 12:19 PM. If you have any questions, ask your nurse or doctor.          amLODipine 5 MG tablet Commonly known as: NORVASC Take 1.5 tablets (7.5 mg total) by mouth daily.   atorvastatin 40 MG tablet Commonly known as: LIPITOR Take 1 tablet (40 mg total) by mouth daily. For cholesterol   donepezil 5 MG tablet Commonly known as: ARICEPT Take 1 tablet (5 mg total) by mouth at bedtime. What changed: See the new instructions. Changed by: Kameran Lallier   fluticasone 50 MCG/ACT nasal spray Commonly known as: FLONASE SPRAY 2 SPRAYS INTO EACH NOSTRIL EVERY DAY   levocetirizine 5 MG tablet Commonly known as: XYZAL TAKE 1 TABLET BY MOUTH EVERY DAY IN THE EVENING   multivitamin with minerals Tabs tablet Take 1 tablet by mouth daily.   OVER THE COUNTER MEDICATION Cream for arthritis pain   VITAMIN B 12 PO Take by mouth.         Follow-up: Return in about 3 months (around 04/20/2023).  Mechele Claude, M.D.

## 2023-01-20 LAB — CMP14+EGFR
ALT: 20 [IU]/L (ref 0–44)
AST: 24 [IU]/L (ref 0–40)
Albumin: 4.4 g/dL (ref 3.7–4.7)
Alkaline Phosphatase: 62 [IU]/L (ref 44–121)
BUN/Creatinine Ratio: 21 (ref 10–24)
BUN: 19 mg/dL (ref 8–27)
Bilirubin Total: 0.9 mg/dL (ref 0.0–1.2)
CO2: 23 mmol/L (ref 20–29)
Calcium: 9.7 mg/dL (ref 8.6–10.2)
Chloride: 105 mmol/L (ref 96–106)
Creatinine, Ser: 0.89 mg/dL (ref 0.76–1.27)
Globulin, Total: 2.2 g/dL (ref 1.5–4.5)
Glucose: 91 mg/dL (ref 70–99)
Potassium: 3.8 mmol/L (ref 3.5–5.2)
Sodium: 141 mmol/L (ref 134–144)
Total Protein: 6.6 g/dL (ref 6.0–8.5)
eGFR: 86 mL/min/{1.73_m2} (ref >59)

## 2023-01-20 LAB — CBC WITH DIFFERENTIAL/PLATELET
Basophils Absolute: 0 10*3/uL (ref 0.0–0.2)
Basos: 0 %
EOS (ABSOLUTE): 0.1 10*3/uL (ref 0.0–0.4)
Eos: 1 %
Hematocrit: 46.1 % (ref 37.5–51.0)
Hemoglobin: 15.3 g/dL (ref 13.0–17.7)
Immature Grans (Abs): 0 10*3/uL (ref 0.0–0.1)
Immature Granulocytes: 0 %
Lymphocytes Absolute: 1.5 10*3/uL (ref 0.7–3.1)
Lymphs: 26 %
MCH: 32.8 pg (ref 26.6–33.0)
MCHC: 33.2 g/dL (ref 31.5–35.7)
MCV: 99 fL — ABNORMAL HIGH (ref 79–97)
Monocytes Absolute: 0.4 10*3/uL (ref 0.1–0.9)
Monocytes: 8 %
Neutrophils Absolute: 3.8 10*3/uL (ref 1.4–7.0)
Neutrophils: 65 %
Platelets: 176 10*3/uL (ref 150–450)
RBC: 4.66 x10E6/uL (ref 4.14–5.80)
RDW: 12.3 % (ref 11.6–15.4)
WBC: 5.9 10*3/uL (ref 3.4–10.8)

## 2023-01-20 LAB — LIPID PANEL
Chol/HDL Ratio: 2.5 {ratio} (ref 0.0–5.0)
Cholesterol, Total: 149 mg/dL (ref 100–199)
HDL: 59 mg/dL (ref >39)
LDL Chol Calc (NIH): 73 mg/dL (ref 0–99)
Triglycerides: 91 mg/dL (ref 0–149)
VLDL Cholesterol Cal: 17 mg/dL (ref 5–40)

## 2023-01-20 NOTE — Progress Notes (Signed)
Hello Martavious,  Your lab result is normal and/or stable.Some minor variations that are not significant are commonly marked abnormal, but do not represent any medical problem for you.  Best regards, Claretta Fraise, M.D.

## 2023-02-14 ENCOUNTER — Encounter: Payer: Self-pay | Admitting: Family Medicine

## 2023-03-12 ENCOUNTER — Encounter: Payer: Self-pay | Admitting: Diagnostic Neuroimaging

## 2023-03-17 ENCOUNTER — Encounter: Payer: Self-pay | Admitting: *Deleted

## 2023-03-17 ENCOUNTER — Telehealth: Payer: Self-pay | Admitting: Diagnostic Neuroimaging

## 2023-03-17 DIAGNOSIS — Z0289 Encounter for other administrative examinations: Secondary | ICD-10-CM

## 2023-03-17 NOTE — Telephone Encounter (Signed)
Pt's daughter called to pay fee to have FMLA paperwork filled out to be able to take leave to help father move to assistant living

## 2023-03-30 NOTE — Telephone Encounter (Signed)
Paperwork completed and in MD office for Review and Signature

## 2023-04-01 ENCOUNTER — Telehealth: Payer: Self-pay | Admitting: *Deleted

## 2023-04-01 NOTE — Telephone Encounter (Signed)
Pt FMLA form was email today to [email or web address]

## 2023-04-06 ENCOUNTER — Other Ambulatory Visit: Payer: Self-pay | Admitting: Family Medicine

## 2023-04-06 DIAGNOSIS — I10 Essential (primary) hypertension: Secondary | ICD-10-CM

## 2023-04-18 ENCOUNTER — Other Ambulatory Visit: Payer: Self-pay | Admitting: Family Medicine

## 2023-04-18 DIAGNOSIS — I10 Essential (primary) hypertension: Secondary | ICD-10-CM

## 2023-04-18 DIAGNOSIS — J301 Allergic rhinitis due to pollen: Secondary | ICD-10-CM

## 2023-04-20 ENCOUNTER — Telehealth: Payer: Self-pay | Admitting: Family Medicine

## 2023-04-20 MED ORDER — AMLODIPINE BESYLATE 5 MG PO TABS: 5.0000 mg | ORAL_TABLET | Freq: Every day | ORAL | 0 refills | Status: DC

## 2023-04-20 NOTE — Telephone Encounter (Incomplete)
  Prescription Request  04/20/2023  Is this a "Controlled Substance" medicine? no  Have you seen your PCP in the last 2 weeks? ***  If YES, route message to pool  -  If NO, patient needs to be scheduled for appointment.  What is the name of the medication or equipment? Pt lost medication. He need amlodipine sent into pharmacy. He has not had rx for 3 days  Have you contacted your pharmacy to request a refill? no   Which [facility]   Patient notified that their request is being sent to the clinical staff for review and that they should receive a response within 2 business days.

## 2023-04-20 NOTE — Telephone Encounter (Signed)
  Prescription Request  04/20/2023  Is this a "Controlled Substance" medicine? no  Have you seen your PCP in the last 2 weeks? Appt 04/29/23  If YES, route message to pool  -  If NO, patient needs to be scheduled for appointment.  What is the name of the medication or equipment? Pt lost his amlodipine and has been out for 3 days  Have you contacted your pharmacy to request a refill? no   [facility]   Patient notified that their request is being sent to the clinical staff for review and that they should receive a response within 2 business days.

## 2023-04-20 NOTE — Telephone Encounter (Signed)
Closing encounter taken care of in another refill request.

## 2023-04-23 ENCOUNTER — Ambulatory Visit: Payer: Medicare Other | Admitting: Family Medicine

## 2023-04-29 ENCOUNTER — Encounter: Payer: Self-pay | Admitting: Family Medicine

## 2023-04-29 ENCOUNTER — Ambulatory Visit (INDEPENDENT_AMBULATORY_CARE_PROVIDER_SITE_OTHER): Payer: Medicare PPO | Admitting: Family Medicine

## 2023-04-29 ENCOUNTER — Ambulatory Visit (INDEPENDENT_AMBULATORY_CARE_PROVIDER_SITE_OTHER): Payer: Medicare PPO

## 2023-04-29 VITALS — BP 145/58 | HR 60 | Temp 97.4°F | Ht 70.5 in | Wt 189.4 lb

## 2023-04-29 DIAGNOSIS — M19041 Primary osteoarthritis, right hand: Secondary | ICD-10-CM

## 2023-04-29 DIAGNOSIS — F02A0 Dementia in other diseases classified elsewhere, mild, without behavioral disturbance, psychotic disturbance, mood disturbance, and anxiety: Secondary | ICD-10-CM | POA: Diagnosis not present

## 2023-04-29 DIAGNOSIS — G301 Alzheimer's disease with late onset: Secondary | ICD-10-CM | POA: Diagnosis not present

## 2023-04-29 DIAGNOSIS — M1811 Unilateral primary osteoarthritis of first carpometacarpal joint, right hand: Secondary | ICD-10-CM | POA: Diagnosis not present

## 2023-04-29 DIAGNOSIS — M19049 Primary osteoarthritis, unspecified hand: Secondary | ICD-10-CM | POA: Diagnosis not present

## 2023-04-29 DIAGNOSIS — M19042 Primary osteoarthritis, left hand: Secondary | ICD-10-CM

## 2023-04-29 DIAGNOSIS — E782 Mixed hyperlipidemia: Secondary | ICD-10-CM

## 2023-04-29 DIAGNOSIS — I10 Essential (primary) hypertension: Secondary | ICD-10-CM | POA: Diagnosis not present

## 2023-04-29 DIAGNOSIS — M1812 Unilateral primary osteoarthritis of first carpometacarpal joint, left hand: Secondary | ICD-10-CM | POA: Diagnosis not present

## 2023-04-29 DIAGNOSIS — M79641 Pain in right hand: Secondary | ICD-10-CM | POA: Diagnosis not present

## 2023-04-29 DIAGNOSIS — M79642 Pain in left hand: Secondary | ICD-10-CM | POA: Diagnosis not present

## 2023-04-29 LAB — LIPID PANEL

## 2023-04-29 MED ORDER — DONEPEZIL HCL 10 MG PO TABS: 10.0000 mg | ORAL_TABLET | Freq: Every day | ORAL | 3 refills | Status: DC

## 2023-04-29 MED ORDER — CELECOXIB 200 MG PO CAPS: 200.0000 mg | ORAL_CAPSULE | Freq: Every day | ORAL | 5 refills | Status: DC

## 2023-04-29 NOTE — Progress Notes (Signed)
 Subjective:  Patient ID: David Guerrero, male    DOB: Aug 24, 1940  Age: 83 y.o. MRN: [id number]  CC: Medical Management of Chronic Issues   HPI David Guerrero presents for Big shift in his cognition for interpreting numbers, reading - both have declined since periodontal surgery. New tasks and technological tasks are challenging. Remembers routine repetative tasks well.      04/29/2023    9:42 AM 01/19/2023   10:10 AM 09/25/2022    4:02 PM  Depression screen PHQ 2/9  Decreased Interest 0 0 0  Down, Depressed, Hopeless 0 0 0  PHQ - 2 Score 0 0 0    History David Guerrero has a past medical history of Cancer [facility]), Cataract (12/14/2008), Hyperlipidemia, and Memory deficit.   He has a past surgical history that includes Eye surgery (2010) and Melanoma excision.   His family history includes Arthritis in his mother; Cancer in his father.He reports that he quit smoking about 42 years ago. His smoking use included cigarettes. He started smoking about 52 years ago. He has a 5 pack-year smoking history. He has never used smokeless tobacco. He reports current alcohol use of about 2.0 - 3.0 standard drinks of alcohol per week. He reports that he does not use drugs.    ROS Review of Systems  Constitutional:  Negative for fever.  Respiratory:  Negative for shortness of breath.   Cardiovascular:  Negative for chest pain.  Musculoskeletal:  Positive for arthralgias (hands are painful. Hurts to do High 5 , shake hands, grip is poor.).  Skin:  Negative for rash.    Objective:  BP (!) 145/58   Pulse 60   Temp (!) 97.4 F (36.3 C)   Ht 5' 10.5 (1.791 m)   Wt 189 lb 6.4 oz (85.9 kg)   SpO2 95%   BMI 26.79 kg/m   BP Readings from Last 3 Encounters:  04/29/23 (!) 145/58  01/19/23 (!) 116/59  01/06/23 (!) 151/64    Wt Readings from Last 3 Encounters:  04/29/23 189 lb 6.4 oz (85.9 kg)  01/19/23 179 lb 6.4 oz (81.4 kg)  01/06/23 182 lb (82.6 kg)     Physical Exam Constitutional:       General: He is not in acute distress.    Appearance: He is well-developed.  HENT:     Head: Normocephalic and atraumatic.     Right Ear: External ear normal.     Left Ear: External ear normal.     Nose: Nose normal.  Eyes:     Conjunctiva/sclera: Conjunctivae normal.     Pupils: Pupils are equal, round, and reactive to light.  Cardiovascular:     Rate and Rhythm: Normal rate and regular rhythm.     Heart sounds: Normal heart sounds. No murmur heard. Pulmonary:     Effort: Pulmonary effort is normal. No respiratory distress.     Breath sounds: Normal breath sounds. No wheezing or rales.  Abdominal:     Palpations: Abdomen is soft.     Tenderness: There is no abdominal tenderness.  Musculoskeletal:        General: Normal range of motion.     Cervical back: Normal range of motion and neck supple.  Skin:    General: Skin is warm and dry.  Neurological:     Mental Status: He is alert and oriented to person, place, and time.     Deep Tendon Reflexes: Reflexes are normal and symmetric.  Psychiatric:  Behavior: Behavior normal.        Thought Content: Thought content normal.        Judgment: Judgment normal.       Assessment & Plan:   [place] was seen today for medical management of chronic issues.  Diagnoses and all orders for this visit:  Mild late onset Alzheimer's dementia without behavioral disturbance, psychotic disturbance, mood disturbance, or anxiety (HCC)  Essential hypertension -     CBC with Differential/Platelet -     CMP14+EGFR  Mixed hyperlipidemia -     Lipid panel  Arthritis of both hands -     Ambulatory referral to Physical Therapy -     DG Hand Complete Left; Future -     DG Hand Complete Right; Future -     Rheumatoid factor  Other orders -     donepezil  (ARICEPT ) 10 MG tablet; Take 1 tablet (10 mg total) by mouth at bedtime. -     celecoxib  (CELEBREX ) 200 MG capsule; Take 1 capsule (200 mg total) by mouth daily. With food       I  have changed David C. David Guerrero's donepezil . I am also having him start on celecoxib . Additionally, I am having him maintain his Cyanocobalamin  (VITAMIN B 12 PO), multivitamin with minerals, fluticasone , OVER THE COUNTER MEDICATION, atorvastatin , levocetirizine, and amLODipine .  Allergies as of 04/29/2023   No Known Allergies      Medication List        Accurate as of April 29, 2023  8:25 PM. If you have any questions, ask your nurse or doctor.          amLODipine  5 MG tablet Commonly known as: NORVASC  Take 1 tablet (5 mg total) by mouth daily.   atorvastatin  40 MG tablet Commonly known as: LIPITOR Take 1 tablet (40 mg total) by mouth daily. For cholesterol   celecoxib  200 MG capsule Commonly known as: CeleBREX  Take 1 capsule (200 mg total) by mouth daily. With food Started by: Jazmarie Biever   donepezil  10 MG tablet Commonly known as: ARICEPT  Take 1 tablet (10 mg total) by mouth at bedtime. What changed:  medication strength how much to take Changed by: Sarayah Bacchi   fluticasone  50 MCG/ACT nasal spray Commonly known as: FLONASE  SPRAY 2 SPRAYS INTO EACH NOSTRIL EVERY DAY   levocetirizine 5 MG tablet Commonly known as: XYZAL  TAKE 1 TABLET BY MOUTH EVERY DAY IN THE EVENING   multivitamin with minerals Tabs tablet Take 1 tablet by mouth daily.   OVER THE COUNTER MEDICATION Cream for arthritis pain   VITAMIN B 12 PO Take by mouth.         Follow-up: Return in about 3 months (around 07/28/2023).  Butler Der, M.D.

## 2023-04-30 LAB — CBC WITH DIFFERENTIAL/PLATELET
Basophils Absolute: 0 10*3/uL (ref 0.0–0.2)
Basos: 0 %
EOS (ABSOLUTE): 0.1 10*3/uL (ref 0.0–0.4)
Eos: 1 %
Hematocrit: 44.1 % (ref 37.5–51.0)
Hemoglobin: 14.5 g/dL (ref 13.0–17.7)
Immature Grans (Abs): 0 10*3/uL (ref 0.0–0.1)
Immature Granulocytes: 0 %
Lymphocytes Absolute: 1 10*3/uL (ref 0.7–3.1)
Lymphs: 20 %
MCH: 31.5 pg (ref 26.6–33.0)
MCHC: 32.9 g/dL (ref 31.5–35.7)
MCV: 96 fL (ref 79–97)
Monocytes Absolute: 0.4 10*3/uL (ref 0.1–0.9)
Monocytes: 9 %
Neutrophils Absolute: 3.3 10*3/uL (ref 1.4–7.0)
Neutrophils: 70 %
Platelets: 162 10*3/uL (ref 150–450)
RBC: 4.6 x10E6/uL (ref 4.14–5.80)
RDW: 12.6 % (ref 11.6–15.4)
WBC: 4.8 10*3/uL (ref 3.4–10.8)

## 2023-04-30 LAB — CMP14+EGFR
ALT: 13 IU/L (ref 0–44)
AST: 17 IU/L (ref 0–40)
Albumin: 4.1 g/dL (ref 3.7–4.7)
Alkaline Phosphatase: 69 IU/L (ref 44–121)
BUN/Creatinine Ratio: 14 (ref 10–24)
BUN: 16 mg/dL (ref 8–27)
Bilirubin Total: 0.8 mg/dL (ref 0.0–1.2)
CO2: 24 mmol/L (ref 20–29)
Calcium: 9.5 mg/dL (ref 8.6–10.2)
Chloride: 106 mmol/L (ref 96–106)
Creatinine, Ser: 1.13 mg/dL (ref 0.76–1.27)
Globulin, Total: 2.1 g/dL (ref 1.5–4.5)
Glucose: 93 mg/dL (ref 70–99)
Potassium: 3.8 mmol/L (ref 3.5–5.2)
Sodium: 143 mmol/L (ref 134–144)
Total Protein: 6.2 g/dL (ref 6.0–8.5)
eGFR: 64 mL/min/{1.73_m2} (ref >59)

## 2023-04-30 LAB — LIPID PANEL
Cholesterol, Total: 141 mg/dL (ref 100–199)
HDL: 63 mg/dL (ref >39)
LDL CALC COMMENT:: 2.2 ratio (ref 0.0–5.0)
LDL Chol Calc (NIH): 57 mg/dL (ref 0–99)
Triglycerides: 121 mg/dL (ref 0–149)
VLDL Cholesterol Cal: 21 mg/dL (ref 5–40)

## 2023-05-01 LAB — SPECIMEN STATUS REPORT

## 2023-05-01 LAB — RHEUMATOID FACTOR: Rheumatoid fact SerPl-aCnc: <10 [IU]/mL (ref <14.0)

## 2023-05-04 NOTE — Progress Notes (Signed)
Hello Martavious,  Your lab result is normal and/or stable.Some minor variations that are not significant are commonly marked abnormal, but do not represent any medical problem for you.  Best regards, Claretta Fraise, M.D.

## 2023-05-12 DIAGNOSIS — M6281 Muscle weakness (generalized): Secondary | ICD-10-CM | POA: Diagnosis not present

## 2023-05-13 DIAGNOSIS — M6281 Muscle weakness (generalized): Secondary | ICD-10-CM | POA: Diagnosis not present

## 2023-05-15 ENCOUNTER — Other Ambulatory Visit: Payer: Self-pay | Admitting: Family Medicine

## 2023-05-15 DIAGNOSIS — I10 Essential (primary) hypertension: Secondary | ICD-10-CM

## 2023-05-18 DIAGNOSIS — M6281 Muscle weakness (generalized): Secondary | ICD-10-CM | POA: Diagnosis not present

## 2023-05-20 DIAGNOSIS — M6281 Muscle weakness (generalized): Secondary | ICD-10-CM | POA: Diagnosis not present

## 2023-05-25 DIAGNOSIS — M6281 Muscle weakness (generalized): Secondary | ICD-10-CM | POA: Diagnosis not present

## 2023-05-26 DIAGNOSIS — M6281 Muscle weakness (generalized): Secondary | ICD-10-CM | POA: Diagnosis not present

## 2023-06-01 DIAGNOSIS — M6281 Muscle weakness (generalized): Secondary | ICD-10-CM | POA: Diagnosis not present

## 2023-06-04 DIAGNOSIS — M6281 Muscle weakness (generalized): Secondary | ICD-10-CM | POA: Diagnosis not present

## 2023-06-08 DIAGNOSIS — M6281 Muscle weakness (generalized): Secondary | ICD-10-CM | POA: Diagnosis not present

## 2023-06-10 DIAGNOSIS — M6281 Muscle weakness (generalized): Secondary | ICD-10-CM | POA: Diagnosis not present

## 2023-06-16 DIAGNOSIS — M6281 Muscle weakness (generalized): Secondary | ICD-10-CM | POA: Diagnosis not present

## 2023-06-18 ENCOUNTER — Other Ambulatory Visit: Payer: Self-pay | Admitting: Family Medicine

## 2023-06-18 DIAGNOSIS — I10 Essential (primary) hypertension: Secondary | ICD-10-CM

## 2023-06-22 ENCOUNTER — Other Ambulatory Visit: Payer: Self-pay | Admitting: *Deleted

## 2023-06-22 DIAGNOSIS — I10 Essential (primary) hypertension: Secondary | ICD-10-CM

## 2023-06-22 DIAGNOSIS — E782 Mixed hyperlipidemia: Secondary | ICD-10-CM

## 2023-06-22 MED ORDER — ATORVASTATIN CALCIUM 40 MG PO TABS: 40.0000 mg | ORAL_TABLET | Freq: Every day | ORAL | 1 refills | Status: AC

## 2023-06-22 MED ORDER — DONEPEZIL HCL 10 MG PO TABS: 10.0000 mg | ORAL_TABLET | Freq: Every day | ORAL | 2 refills | Status: AC

## 2023-06-22 MED ORDER — AMLODIPINE BESYLATE 5 MG PO TABS: 7.5000 mg | ORAL_TABLET | Freq: Every day | ORAL | 0 refills | Status: DC

## 2023-06-22 MED ORDER — CELECOXIB 200 MG PO CAPS: 200.0000 mg | ORAL_CAPSULE | Freq: Every day | ORAL | 1 refills | Status: DC

## 2023-06-23 DIAGNOSIS — M6281 Muscle weakness (generalized): Secondary | ICD-10-CM | POA: Diagnosis not present

## 2023-06-25 DIAGNOSIS — M6281 Muscle weakness (generalized): Secondary | ICD-10-CM | POA: Diagnosis not present

## 2023-06-29 DIAGNOSIS — M6281 Muscle weakness (generalized): Secondary | ICD-10-CM | POA: Diagnosis not present

## 2023-07-03 DIAGNOSIS — M6281 Muscle weakness (generalized): Secondary | ICD-10-CM | POA: Diagnosis not present

## 2023-07-06 DIAGNOSIS — H02834 Dermatochalasis of left upper eyelid: Secondary | ICD-10-CM | POA: Diagnosis not present

## 2023-07-06 DIAGNOSIS — H40013 Open angle with borderline findings, low risk, bilateral: Secondary | ICD-10-CM | POA: Diagnosis not present

## 2023-07-06 DIAGNOSIS — H35371 Puckering of macula, right eye: Secondary | ICD-10-CM | POA: Diagnosis not present

## 2023-07-06 DIAGNOSIS — H02831 Dermatochalasis of right upper eyelid: Secondary | ICD-10-CM | POA: Diagnosis not present

## 2023-07-08 DIAGNOSIS — M6281 Muscle weakness (generalized): Secondary | ICD-10-CM | POA: Diagnosis not present

## 2023-07-09 DIAGNOSIS — M6281 Muscle weakness (generalized): Secondary | ICD-10-CM | POA: Diagnosis not present

## 2023-07-13 ENCOUNTER — Ambulatory Visit (INDEPENDENT_AMBULATORY_CARE_PROVIDER_SITE_OTHER): Admitting: Nurse Practitioner

## 2023-07-13 ENCOUNTER — Encounter: Payer: Self-pay | Admitting: Nurse Practitioner

## 2023-07-13 ENCOUNTER — Ambulatory Visit: Payer: Medicare PPO | Admitting: Family Medicine

## 2023-07-13 VITALS — BP 137/60 | HR 53 | Temp 98.2°F | Ht 70.0 in | Wt 187.0 lb

## 2023-07-13 DIAGNOSIS — J301 Allergic rhinitis due to pollen: Secondary | ICD-10-CM

## 2023-07-13 MED ORDER — PREDNISONE 20 MG PO TABS: 40.0000 mg | ORAL_TABLET | Freq: Every day | ORAL | 0 refills | Status: AC

## 2023-07-13 NOTE — Progress Notes (Signed)
 Subjective:    Patient ID: David Guerrero, male    DOB: 06/05/1940, 83 y.o.   MRN: [id number]   Chief Complaint: sinus drainage (For 2 weeks/)   URI  This is a new problem. The current episode started in the past 7 days. The problem has been waxing and waning. There has been no fever. Associated symptoms include congestion, rhinorrhea and sneezing. Pertinent negatives include no coughing. Treatments tried: flonase and xyzal. The treatment provided mild relief.    Patient Active Problem List   Diagnosis Date Noted   Mild late onset Alzheimer's dementia without behavioral disturbance, psychotic disturbance, mood disturbance, or anxiety (HCC) 09/29/2022   Dermatitis of both ear canals 04/28/2022   Essential hypertension 04/28/2022   Arthritis of hand 04/28/2022   Dysplastic nevus 10/26/2020   Inflamed seborrheic keratosis 01/26/2019   Dysplastic nevus of trunk 05/03/2018   Neoplasm of uncertain behavior of skin 05/04/2017   Overweight (BMI 25.0-29.9) 12/18/2015   Hyperlipidemia 12/15/2014   Multiple benign nevi 10/30/2014   Actinic keratosis 10/14/2012   Personal history of other malignant neoplasm of skin 10/14/2012       Review of Systems  Constitutional:  Negative for chills and fever.  HENT:  Positive for congestion, rhinorrhea and sneezing.   Respiratory:  Negative for cough.        Objective:   Physical Exam Constitutional:      Appearance: Normal appearance.  HENT:     Right Ear: Tympanic membrane normal.     Left Ear: Tympanic membrane normal.     Nose: Congestion and rhinorrhea present.     Comments: Boggy turminates bil    Mouth/Throat:     Pharynx: No oropharyngeal exudate or posterior oropharyngeal erythema.  Cardiovascular:     Rate and Rhythm: Normal rate and regular rhythm.     Heart sounds: Normal heart sounds.  Pulmonary:     Effort: Pulmonary effort is normal.     Breath sounds: Normal breath sounds.  Skin:    General: Skin is warm.   Neurological:     General: No focal deficit present.     Mental Status: He is alert and oriented to person, place, and time.  Psychiatric:        Mood and Affect: Mood normal.        Behavior: Behavior normal.     BP 137/60   Pulse (!) 53   Temp 98.2 F (36.8 C) (Temporal)   Ht 5\' 10"  (1.778 m)   Wt 187 lb (84.8 kg)   SpO2 92%   BMI 26.83 kg/m        Assessment & Plan:   David Guerrero in today with chief complaint of sinus drainage (For 2 weeks/)   1. Seasonal allergic rhinitis due to pollen (Primary) Avoid being outside Wear mask  Meds ordered this encounter  Medications   predniSONE (DELTASONE) 20 MG tablet    Sig: Take 2 tablets (40 mg total) by mouth daily with breakfast for 5 days. 2 po daily for 5 days    Dispense:  10 tablet    Refill:  0    Supervising Provider:   Arville Care A [[id number]]      The above assessment and management plan was discussed with the patient. The patient verbalized understanding of and has agreed to the management plan. Patient is aware to call the clinic if symptoms persist or worsen. Patient is aware when to return to the clinic for a  follow-up visit. Patient educated on when it is appropriate to go to the emergency department.   Mary-Margaret Daphine Deutscher, FNP

## 2023-07-13 NOTE — Patient Instructions (Signed)
 Allergic Rhinitis, Adult  Allergic rhinitis is a reaction to allergens. Allergens are things that can cause an allergic reaction. This condition affects the lining inside the nose (mucous membrane). There are two types of allergic rhinitis: Seasonal. This type is also called hay fever. It happens only during some times of the year. Perennial. This type can happen at any time of the year. This condition cannot be spread from person to person (is not contagious). It can be mild, bad, or very bad. It can develop at any age and may be outgrown. What are the causes? Pollen from grasses, trees, and weeds. Other causes can be: Dust mites. Smoke. Mold. Car fumes. The pee (urine), spit, or dander of pets. Dander is dead skin cells from a pet. What increases the risk? You are more likely to develop this condition if: You have allergies in your family. You have problems like allergies in your family. You may have: Swelling of parts of your eyes and eyelids. Asthma. This affects how you breathe. Long-term redness and swelling on your skin. Food allergies. What are the signs or symptoms? The main symptom of this condition is a runny or stuffy nose (nasal congestion). Other symptoms may include: Sneezing or coughing. Itching and tearing of your eyes. Mucus that drips down the back of your throat (postnasal drip). This may cause a sore throat. Trouble sleeping. Feeling tired. Headache. How is this treated? There is no cure for this condition. You should avoid things that you are allergic to. Treatment can help to relieve symptoms. This may include: Medicines that block allergy symptoms, such as anti-inflammatories or antihistamines. These may be given as a shot, nasal spray, or pill. Avoiding things you are allergic to. Medicines that give you some of what you are allergic to over time. This is called immunotherapy. It is done if other treatments do not help. You may get: Shots. Medicine under  your tongue. Stronger medicines, if other treatments do not help. Follow these instructions at home: Avoiding allergens Find out what things you are allergic to and avoid them. To do this, try these things: If you get allergies any time of year: Replace carpet with wood, tile, or vinyl flooring. Carpet can trap pet dander and dust. Do not smoke. Do not allow smoking in your home. Change your heating and air conditioning filters at least once a month. If you get allergies only some times of the year: Keep windows closed when you can. Plan things to do outside when pollen counts are lowest. Check pollen counts before you plan things to do outside. When you come indoors, change your clothes and shower before you sit on furniture or bedding. If you are allergic to a pet: Keep the pet out of your bedroom. Vacuum, sweep, and dust often. General instructions Take over-the-counter and prescription medicines only as told by your doctor. Drink enough fluid to keep your pee pale yellow. Where to find more information American Academy of Allergy, Asthma & Immunology: aaaai.org Contact a doctor if: You have a fever. You get a cough that does not go away. You make high-pitched whistling sounds when you breathe, most often when you breathe out (wheeze). Your symptoms slow you down. Your symptoms stop you from doing your normal things each day. Get help right away if: You are short of breath. This symptom may be an emergency. Get help right away. Call 911. Do not wait to see if the symptom will go away. Do not drive yourself to the  hospital. This information is not intended to replace advice given to you by your health care provider. Make sure you discuss any questions you have with your health care provider. Document Revised: 12/16/2021 Document Reviewed: 12/16/2021 Elsevier Patient Education  2024 [employer].

## 2023-07-14 ENCOUNTER — Ambulatory Visit (INDEPENDENT_AMBULATORY_CARE_PROVIDER_SITE_OTHER): Payer: Medicare PPO

## 2023-07-14 VITALS — Ht 70.5 in | Wt 189.0 lb

## 2023-07-14 DIAGNOSIS — Z0000 Encounter for general adult medical examination without abnormal findings: Secondary | ICD-10-CM | POA: Diagnosis not present

## 2023-07-14 NOTE — Progress Notes (Signed)
 Subjective:   David Guerrero is a 83 y.o. who presents for a Medicare Wellness preventive visit.  Visit Complete: Virtual I connected with  David Guerrero on 07/14/23 by a audio enabled telemedicine application and verified that I am speaking with the correct person using two identifiers.  Patient Location: Home  Provider Location: Home Office  I discussed the limitations of evaluation and management by telemedicine. The patient expressed understanding and agreed to proceed.  Vital Signs: Because this visit was a virtual/telehealth visit, some criteria may be missing or patient reported. Any vitals not documented were not able to be obtained and vitals that have been documented are patient reported.  VideoDeclined- This patient declined Librarian, academic. Therefore the visit was completed with audio only.  Persons Participating in Visit: Patient.  AWV Questionnaire: No: Patient Medicare AWV questionnaire was not completed prior to this visit.  Cardiac Risk Factors include: advanced age (>50men, >35 women);male gender;dyslipidemia     Objective:    Today's Vitals   07/14/23 0904  Weight: 189 lb (85.7 kg)  Height: 5' 10.5" (1.791 m)   Body mass index is 26.74 kg/m.     07/14/2023    9:30 AM 07/09/2022    2:55 PM 06/07/2021    8:25 AM 06/06/2020    8:19 AM 11/26/2017    3:31 PM  Advanced Directives  Does Patient Have a Medical Advance Directive? No Yes Yes Yes Yes  Type of Special educational needs teacher of [place];Living will Healthcare Power of [place];Living will Healthcare Power of [place];Living will Living will  Does patient want to make changes to medical advance directive?  No - Patient declined  No - Patient declined No - Patient declined  Copy of Healthcare Power of Attorney in Chart?  No - copy requested No - copy requested No - copy requested   Would patient like information on creating a medical advance directive? Yes  (MAU/Ambulatory/Procedural Areas - Information given)        Current Medications (verified) Outpatient Encounter Medications as of 07/14/2023  Medication Sig   amLODipine (NORVASC) 5 MG tablet Take 1.5 tablets (7.5 mg total) by mouth daily.   atorvastatin (LIPITOR) 40 MG tablet Take 1 tablet (40 mg total) by mouth daily. For cholesterol   celecoxib (CELEBREX) 200 MG capsule Take 1 capsule (200 mg total) by mouth daily. With food   Cyanocobalamin (VITAMIN B 12 PO) Take by mouth.   donepezil (ARICEPT) 10 MG tablet Take 1 tablet (10 mg total) by mouth at bedtime.   fluticasone (FLONASE) 50 MCG/ACT nasal spray SPRAY 2 SPRAYS INTO EACH NOSTRIL EVERY DAY   levocetirizine (XYZAL) 5 MG tablet TAKE 1 TABLET BY MOUTH EVERY DAY IN THE EVENING   Multiple Vitamin (MULTIVITAMIN WITH MINERALS) TABS tablet Take 1 tablet by mouth daily.   OVER THE COUNTER MEDICATION Cream for arthritis pain   predniSONE (DELTASONE) 20 MG tablet Take 2 tablets (40 mg total) by mouth daily with breakfast for 5 days. 2 po daily for 5 days   No facility-administered encounter medications on file as of 07/14/2023.    Allergies (verified) Patient has no known allergies.   History: Past Medical History:  Diagnosis Date   Cancer (HCC)    melanoma, removed apprx 2013, early stage per pt   Cataract 12/14/2008   Hyperlipidemia    Memory deficit    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   Past Surgical History:  Procedure Laterality Date  EYE SURGERY  2010   Cataracts   MELANOMA EXCISION     L upper arm   Family History  Problem Relation Age of Onset   Arthritis Mother    Cancer Father        lung   Social History   Socioeconomic History   Marital status: Significant Other    Spouse name: Not on file   Number of children: 1   Years of education: Not on file   Highest education level: Bachelor's degree (e.g., BA, AB, BS)  Occupational History   Occupation: Retired   Occupation: Self Employed    Comment: [phone]   Tobacco Use   Smoking status: Former    Current packs/day: 0.00    Average packs/day: 0.5 packs/day for 10.0 years (5.0 ttl pk-yrs)    Types: Cigarettes    Start date: 12/11/1970    Quit date: 12/10/1980    Years since quitting: 42.6   Smokeless tobacco: Never  Vaping Use   Vaping status: Never Used  Substance and Sexual Activity   Alcohol use: Yes    Alcohol/week: 2.0 - 3.0 standard drinks of alcohol    Types: 2 - 3 Glasses of wine per week   Drug use: No   Sexual activity: Not Currently  Other Topics Concern   Not on file  Social History Narrative   Lives alone   Social Drivers of Health   Financial Resource Strain: Low Risk  (07/14/2023)   Overall Financial Resource Strain (CARDIA)    Difficulty of Paying Living Expenses: Not hard at all  Food Insecurity: No Food Insecurity (07/14/2023)   Hunger Vital Sign    Worried About Running Out of Food in the Last Year: Never true    Ran Out of Food in the Last Year: Never true  Transportation Needs: No Transportation Needs (07/14/2023)   PRAPARE - Administrator, Civil Service (Medical): No    Lack of Transportation (Non-Medical): No  Physical Activity: Sufficiently Active (07/14/2023)   Exercise Vital Sign    Days of Exercise per Week: 5 days    Minutes of Exercise per Session: 30 min  Stress: No Stress Concern Present (07/14/2023)   [employer] of Occupational Health - Occupational Stress Questionnaire    Feeling of Stress : Not at all  Social Connections: Moderately Integrated (07/14/2023)   Social Connection and Isolation Panel [NHANES]    Frequency of Communication with Friends and Family: More than three times a week    Frequency of Social Gatherings with Friends and Family: Three times a week    Attends Religious Services: More than 4 times per year    Active Member of Clubs or Organizations: Yes    Attends Banker Meetings: More than 4 times per year    Marital Status: Widowed    Tobacco  Counseling Counseling given: Not Answered    Clinical Intake:  Pre-visit preparation completed: Yes  Pain : No/denies pain     Diabetes: No  No results found for: "HGBA1C"   How often do you need to have someone help you when you read instructions, pamphlets, or other written materials from your doctor or pharmacy?: 1 - Never  Interpreter Needed?: No  Information entered by :: Kandis Fantasia LPN   Activities of Daily Living     07/14/2023    9:11 AM  In your present state of health, do you have any difficulty performing the following activities:  Hearing? 0  Vision? 0  Difficulty concentrating or making decisions? 1  Walking or climbing stairs? 0  Dressing or bathing? 0  Doing errands, shopping? 0  Preparing Food and eating ? N  Using the Toilet? N  In the past six months, have you accidently leaked urine? N  Do you have problems with loss of bowel control? N  Managing your Medications? N  Managing your Finances? Y  Housekeeping or managing your Housekeeping? N    Patient Care Team: Mechele Claude, MD as PCP - General (Family Medicine) Basilio Cairo, MD (Dermatology) Delora Fuel, OD (Optometry) Marjory Lies, Glenford Bayley, MD as Consulting Physician (Neurology)  Indicate any recent Medical Services you may have received from other than [facility] providers in the past year (date may be approximate).     Assessment:   This is a routine wellness examination for David Guerrero.  Hearing/Vision screen Hearing Screening - Comments:: Denies hearing difficulties   Vision Screening - Comments:: Wears rx glasses - up to date with routine eye exams with [facility]. Wyn Forster    Goals Addressed             This Visit's Progress    Prevent falls   On track    Stay active and healthy Spend time with family        Depression Screen     07/14/2023    9:14 AM 07/13/2023   11:21 AM 04/29/2023    9:42 AM 01/19/2023   10:10 AM 09/25/2022    4:02 PM 07/09/2022    2:54 PM 06/23/2022     8:51 AM  PHQ 2/9 Scores  PHQ - 2 Score 0 0 0 0 0 0 0  PHQ- 9 Score       0    Fall Risk     07/14/2023    9:35 AM 07/13/2023   11:20 AM 04/29/2023    9:42 AM 01/19/2023   10:10 AM 09/25/2022    4:02 PM  Fall Risk   Falls in the past year? 0 0 0 0 0  Number falls in past yr: 0      Injury with Fall? 0      Risk for fall due to : No Fall Risks      Follow up Falls prevention discussed;Education provided;Falls evaluation completed        MEDICARE RISK AT HOME:  Medicare Risk at Home Any stairs in or around the home?: No If so, are there any without handrails?: No Home free of loose throw rugs in walkways, pet beds, electrical cords, etc?: Yes Adequate lighting in your home to reduce risk of falls?: Yes Life alert?: No Use of a cane, walker or w/c?: No Grab bars in the bathroom?: Yes Shower chair or bench in shower?: No Elevated toilet seat or a handicapped toilet?: Yes  TIMED UP AND GO:  Was the test performed?  No  Cognitive Function: 6CIT completed    01/06/2023   11:58 AM 09/27/2022   11:26 PM 01/20/2022    8:30 AM 03/27/2020   12:45 PM 03/01/2020    8:53 AM  MMSE - Mini Mental State Exam  Orientation to time 3 5 5 5 5   Orientation to Place 4 4 5 5 5   Registration 3 1 3 3 3   Attention/ Calculation 4 4 4 4 5   Recall 1 0 2 2 3   Language- name 2 objects 2 2 1 2 2   Language- repeat 1 1 1 1 1   Language- follow 3 step command 3 3 3  3 3  Language- read & follow direction 1 1 1 1 1   Write a sentence 1 1 1 1 1   Copy design 1 1 1 1 1   Total score 24 23 27 28 30         07/14/2023    9:35 AM 07/09/2022    2:55 PM 06/06/2020    8:24 AM  6CIT Screen  What Year? 0 points 0 points 0 points  What month? 0 points 0 points 0 points  What time? 0 points 0 points 0 points  Count back from 20 0 points 0 points 0 points  Months in reverse 2 points 0 points 0 points  Repeat phrase 4 points 0 points 0 points  Total Score 6 points 0 points 0 points    Immunizations Immunization  History  Administered Date(s) Administered   Influenza, High Dose Seasonal PF 01/13/2015, 12/20/2015, 01/06/2017, 12/20/2019, 01/11/2021   Influenza-Unspecified 01/06/2017, 12/03/2017, 12/09/2018   Moderna Covid-19 Vaccine Bivalent Booster 65yrs & up 01/22/2021   Moderna Sars-Covid-2 Vaccination 05/03/2019, 06/03/2019, 02/15/2020, 10/30/2020   Pfizer(Comirnaty)Fall Seasonal Vaccine 12 years and older 01/24/2022   Pneumococcal Conjugate-13 01/23/2015   Pneumococcal Polysaccharide-23 06/14/2007   Respiratory Syncytial Virus Vaccine,Recomb Aduvanted(Arexvy) 03/17/2022   Td 01/28/2017   Zoster Recombinant(Shingrix) 06/05/2020, 08/06/2020    Screening Tests Health Maintenance  Topic Date Due   COVID-19 Vaccine (7 - 2024-25 season) 12/21/2022   Medicare Annual Wellness (AWV)  07/13/2024   DTaP/Tdap/Td (2 - Tdap) 01/29/2027   Pneumonia Vaccine 33+ Years old  Completed   INFLUENZA VACCINE  Completed   Zoster Vaccines- Shingrix  Completed   HPV VACCINES  Aged Out    Health Maintenance  Health Maintenance Due  Topic Date Due   COVID-19 Vaccine (7 - 2024-25 season) 12/21/2022    Additional Screening:  Vision Screening: Recommended annual ophthalmology exams for early detection of glaucoma and other disorders of the eye.  Dental Screening: Recommended annual dental exams for proper oral hygiene  Community Resource Referral / Chronic Care Management: CRR required this visit?  No   CCM required this visit?  No     Plan:     I have personally reviewed and noted the following in the patient's chart:   Medical and social history Use of alcohol, tobacco or illicit drugs  Current medications and supplements including opioid prescriptions. Patient is not currently taking opioid prescriptions. Functional ability and status Nutritional status Physical activity Advanced directives List of other physicians Hospitalizations, surgeries, and ER visits in previous 12  months Vitals Screenings to include cognitive, depression, and falls Referrals and appointments  In addition, I have reviewed and discussed with patient certain preventive protocols, quality metrics, and best practice recommendations. A written personalized care plan for preventive services as well as general preventive health recommendations were provided to patient.     Kandis Fantasia [place], [place]   [id number]   After Visit Summary: (MyChart) Due to this being a telephonic visit, the after visit summary with patients personalized plan was offered to patient via MyChart   Notes: Nothing significant to report at this time.

## 2023-07-14 NOTE — Patient Instructions (Signed)
 Mr. Krasinski , Thank you for taking time to come for your Medicare Wellness Visit. I appreciate your ongoing commitment to your health goals. Please review the following plan we discussed and let me know if I can assist you in the future.   Referrals/Orders/Follow-Ups/Clinician Recommendations: Aim for 30 minutes of exercise or brisk walking, 6-8 glasses of water, and 5 servings of fruits and vegetables each day.  This is a list of the screening recommended for you and due dates:  Health Maintenance  Topic Date Due   COVID-19 Vaccine (7 - 2024-25 season) 12/21/2022   Medicare Annual Wellness Visit  07/13/2024   DTaP/Tdap/Td vaccine (2 - Tdap) 01/29/2027   Pneumonia Vaccine  Completed   Flu Shot  Completed   Zoster (Shingles) Vaccine  Completed   HPV Vaccine  Aged Out    Advanced directives: (ACP Link)Information on Advanced Care Planning can be found at [facility] [place] Advance Health Care Directives Advance Health Care Directives. [email or web address]   Next Medicare Annual Wellness Visit scheduled for next year: Yes

## 2023-07-15 DIAGNOSIS — M6281 Muscle weakness (generalized): Secondary | ICD-10-CM | POA: Diagnosis not present

## 2023-07-17 DIAGNOSIS — M6281 Muscle weakness (generalized): Secondary | ICD-10-CM | POA: Diagnosis not present

## 2023-07-21 DIAGNOSIS — M6281 Muscle weakness (generalized): Secondary | ICD-10-CM | POA: Diagnosis not present

## 2023-07-28 ENCOUNTER — Encounter: Payer: Self-pay | Admitting: Family Medicine

## 2023-07-28 ENCOUNTER — Ambulatory Visit (INDEPENDENT_AMBULATORY_CARE_PROVIDER_SITE_OTHER): Payer: Medicare PPO | Admitting: Family Medicine

## 2023-07-28 VITALS — BP 157/68 | HR 51 | Temp 97.9°F | Ht 70.5 in | Wt 189.0 lb

## 2023-07-28 DIAGNOSIS — M19042 Primary osteoarthritis, left hand: Secondary | ICD-10-CM

## 2023-07-28 DIAGNOSIS — J300 Vasomotor rhinitis: Secondary | ICD-10-CM

## 2023-07-28 DIAGNOSIS — M19041 Primary osteoarthritis, right hand: Secondary | ICD-10-CM | POA: Diagnosis not present

## 2023-07-28 DIAGNOSIS — E782 Mixed hyperlipidemia: Secondary | ICD-10-CM

## 2023-07-28 DIAGNOSIS — I10 Essential (primary) hypertension: Secondary | ICD-10-CM

## 2023-07-28 MED ORDER — CELECOXIB 200 MG PO CAPS: 200.0000 mg | ORAL_CAPSULE | Freq: Every day | ORAL | 1 refills | Status: DC

## 2023-07-28 MED ORDER — MOMETASONE FUROATE 50 MCG/ACT NA SUSP
2.0000 | Freq: Every day | NASAL | 3 refills | Status: DC
Start: 2023-07-28 — End: 2023-08-27

## 2023-07-28 MED ORDER — FEXOFENADINE HCL 180 MG PO TABS: 180.0000 mg | ORAL_TABLET | Freq: Every day | ORAL | 3 refills | Status: AC

## 2023-07-28 NOTE — Progress Notes (Signed)
 Subjective:  Patient ID: David Guerrero, male    DOB: [birth year]  Age: 83 y.o. MRN: [id number]  CC: Medical Management of Chronic Issues   HPI David Guerrero presents for runny nose arthritis in hands much better.  Continues to have pain in the hands from his arthritis.  He gets partial relief of this from Celebrex.   Follow-up of hypertension. Patient has no history of headache chest pain or shortness of breath or recent cough. Patient also denies symptoms of TIA such as numbness weakness lateralizing. Patient checks  blood pressure at home and has not had any elevated readings recently. Patient denies side effects from his medication. States taking it regularly.   Patient in for follow-up of elevated cholesterol. Doing well without complaints on current medication. Denies side effects of statin including myalgia and arthralgia and nausea. Also in today for liver function testing. Currently no chest pain, shortness of breath or other cardiovascular related symptoms noted.     07/28/2023   10:08 AM 07/14/2023    9:14 AM 07/13/2023   11:21 AM  Depression screen PHQ 2/9  Decreased Interest 0 0 0  Down, Depressed, Hopeless 0 0 0  PHQ - 2 Score 0 0 0  Altered sleeping 0    Tired, decreased energy 0    Change in appetite 0    Feeling bad or failure about yourself  0    Trouble concentrating 0    Moving slowly or fidgety/restless 0    Suicidal thoughts 0    PHQ-9 Score 0    Difficult doing work/chores Not difficult at all      History David Guerrero has a past medical history of Cancer [facility]), Cataract (12/14/2008), Hyperlipidemia, and Memory deficit.   He has a past surgical history that includes Eye surgery (2010) and Melanoma excision.   His family history includes Arthritis in his mother; Cancer in his father.He reports that he quit smoking about 42 years ago. His smoking use included cigarettes. He started smoking about 52 years ago. He has a 5 pack-year smoking history. He has never  used smokeless tobacco. He reports current alcohol use of about 2.0 - 3.0 standard drinks of alcohol per week. He reports that he does not use drugs.    ROS Review of Systems  Constitutional:  Negative for fever.  Respiratory:  Negative for shortness of breath.   Cardiovascular:  Negative for chest pain.  Musculoskeletal:  Negative for arthralgias.  Skin:  Negative for rash.    Objective:  BP (!) 157/68   Pulse (!) 51   Temp 97.9 F (36.6 C)   Ht 5' 10.5" (1.791 m)   Wt 189 lb (85.7 kg)   SpO2 97%   BMI 26.74 kg/m   BP Readings from Last 3 Encounters:  07/28/23 (!) 157/68  07/13/23 137/60  04/29/23 (!) 145/58    Wt Readings from Last 3 Encounters:  07/28/23 189 lb (85.7 kg)  07/14/23 189 lb (85.7 kg)  07/13/23 187 lb (84.8 kg)     Physical Exam Vitals reviewed.  Constitutional:      Appearance: He is well-developed.  HENT:     Head: Normocephalic and atraumatic.     Right Ear: External ear normal.     Left Ear: External ear normal.     Mouth/Throat:     Pharynx: No oropharyngeal exudate or posterior oropharyngeal erythema.  Eyes:     Pupils: Pupils are equal, round, and reactive to light.  Cardiovascular:  Rate and Rhythm: Normal rate and regular rhythm.     Heart sounds: No murmur heard. Pulmonary:     Effort: No respiratory distress.     Breath sounds: Normal breath sounds.  Musculoskeletal:     Cervical back: Normal range of motion and neck supple.  Neurological:     Mental Status: He is alert and oriented to person, place, and time.      Assessment & Plan:  Vasomotor rhinitis  Arthritis of both hands  Essential hypertension  Mixed hyperlipidemia  Other orders -     Celecoxib; Take 1 capsule (200 mg total) by mouth daily. With food  Dispense: 90 capsule; Refill: 1 -     Mometasone Furoate; Place 2 sprays into the nose daily.  Dispense: 3 each; Refill: 3 -     Fexofenadine HCl; Take 1 tablet (180 mg total) by mouth daily. For allergy  symptoms  Dispense: 90 tablet; Refill: 3     Follow-up: Return in about 6 months (around 01/27/2024).  Mechele Claude, M.D.

## 2023-08-04 ENCOUNTER — Telehealth: Payer: Self-pay | Admitting: Family Medicine

## 2023-08-04 NOTE — Telephone Encounter (Signed)
 Daughter says pts Allegra is not covered by Medicare. Wants to know if there is a generic Rx that Medicare will cover or do they just need to buy OTC?

## 2023-08-04 NOTE — Telephone Encounter (Signed)
 Daughter says its either the Allegra or Nasal Spray. She is not sure.

## 2023-08-04 NOTE — Telephone Encounter (Signed)
 Called and spoke with daughter. Advised to buy the allegra OTC. Daughter verbalized understanding

## 2023-08-27 ENCOUNTER — Ambulatory Visit: Admitting: Family Medicine

## 2023-08-27 ENCOUNTER — Encounter: Payer: Self-pay | Admitting: Family Medicine

## 2023-08-27 VITALS — BP 135/65 | HR 54 | Temp 98.3°F | Ht 70.5 in | Wt 189.4 lb

## 2023-08-27 DIAGNOSIS — B029 Zoster without complications: Secondary | ICD-10-CM | POA: Diagnosis not present

## 2023-08-27 MED ORDER — VALACYCLOVIR HCL 1 G PO TABS: 1000.0000 mg | ORAL_TABLET | Freq: Three times a day (TID) | ORAL | 0 refills | Status: AC

## 2023-08-27 NOTE — Patient Instructions (Signed)
 Shingles  Shingles, or herpes zoster, is an infection. It gives you a skin rash and blisters. These infected areas may hurt a lot. Shingles only happens if: You've had chickenpox. You've been given a shot called a vaccine to protect you from getting chickenpox. Shingles is rare in this case. What are the causes? Shingles is caused by a germ called the varicella-zoster virus. This is the same germ that causes chickenpox. After you're exposed to the germ, it stays in your body but is dormant. This means it isn't active. Shingles happens if the germ becomes active again. This can happen years after you're first exposed to the germ. What increases the risk? You may be more likely to get shingles if: You're older than 84 years of age. You're under a lot of stress. You have a weak immune system. The immune system is your body's defense system. It may be weak if: You have human immunodeficiency virus (HIV). You have acquired immunodeficiency syndrome (AIDS). You have cancer. You take medicines that weaken your immune system. These include organ transplant medicines. What are the signs or symptoms? The first symptoms of shingles may be itching, tingling, or pain. Your skin may feel like it's burning. A few days or weeks later, you'll get a rash. Here's what you can expect: The rash is likely to be on one side of your body. The rash may be shaped like a belt or a band. Over time, it will turn into blisters filled with fluid. The blisters will break open and change into scabs. The scabs will dry up in about 2-3 weeks. You may also have: A fever. Chills. A headache. Nausea. How is this diagnosed? Shingles is diagnosed with a skin exam. A sample called a culture may be taken from one of your blisters and sent to a lab. This will show if you have shingles. How is this treated? The rash may last for several weeks. There's no cure for shingles, but your health care provider may give you medicines.  These medicines may: Help with pain. Help with itching. Help with irritation and swelling. Help you get better sooner. Help to prevent long-term problems. If the rash is on your face, you may need to see an eye doctor or an ear, nose, and throat (ENT) doctor. Follow these instructions at home: Medicines Take your medicines only as told by your provider. Put an anti-itch cream or numbing cream on the rash or blisters as told by your provider. Relieving itching and discomfort  To help with itching: Put cold, wet cloths called cold compresses on the rash or blisters. Take a cool bath. Try adding baking soda or dry oatmeal to the water. Do not bathe in hot water. Use calamine lotion on the rash or blisters. You can get this type of lotion at the store. Blister and rash care Keep your rash covered with a loose bandage. Wear loose clothes that don't rub on your rash. Take care of your rash as told by your provider. Make sure you: Wash your hands with soap and water for at least 20 seconds before and after you change your bandage. If you can't use soap and water, use hand sanitizer. Keep your rash and blisters clean by washing them with mild soap and cool water. Change your bandage. Check your rash every day for signs of infection. Check for: More redness, swelling, or pain. Fluid or blood. Warmth. Pus or a bad smell. Do not scratch your rash. Do not pick at your  blisters. To help you not scratch: Keep your fingernails clean and cut short. Try to wear gloves or mittens when you sleep. General instructions Rest. Wash your hands often with soap and water for at least 20 seconds. If you can't use soap and water, use hand sanitizer. Washing your hands lowers your chance of getting a skin infection. Your infection can cause chickenpox in others. If you have blisters that aren't scabs yet, stay away from: Babies. Pregnant people. Children who have eczema. Older people who have organ  transplants. People who have a long-term, or chronic, illness. Anyone who hasn't had chickenpox before. Anyone who hasn't gotten the chickenpox vaccine. How is this prevented? Vaccines are the best way to prevent you from getting chickenpox or shingles. Talk with your provider about getting these shots. Where to find more information Centers for Disease Control and Prevention (CDC): TonerProm[employer].no Contact a health care provider if: Your pain doesn't get better with medicine. Your pain doesn't get better after the rash heals. You have any signs of infection around the rash. Your rash or blisters get worse. You have a fever or chills. Get help right away if: The rash is on your face or nose. You have pain in your face or by your eye. You lose feeling on one side of your face. You have trouble seeing. You have ear pain or ringing in your ear. This information is not intended to replace advice given to you by your health care provider. Make sure you discuss any questions you have with your health care provider. Document Revised: 01/08/2023 Document Reviewed: 05/23/2022 Elsevier Patient Education  2024 [employer].

## 2023-08-27 NOTE — Progress Notes (Signed)
   Acute Office Visit  Subjective:     Patient ID: David Guerrero, male    DOB: 1940-08-27, 83 y.o.   MRN: [id number]  Chief Complaint  Patient presents with   Rash    Rash This is a new problem. The current episode started yesterday. The problem has been gradually worsening since onset. The affected locations include the neck (right posterior neck). The rash is characterized by blistering, itchiness and pain. He was exposed to nothing. Pertinent negatives include no congestion, cough, eye pain, facial edema, fever or shortness of breath. Past treatments include topical steroids. The treatment provided no relief.   No changes in vision, ear pain, changes in hearing, numbness, or tingling. Has had shingrix vaccine.   Review of Systems  Constitutional:  Negative for fever.  HENT:  Negative for congestion.   Eyes:  Negative for pain.  Respiratory:  Negative for cough and shortness of breath.   Skin:  Positive for rash.        Objective:    BP 135/65   Pulse (!) 54   Temp 98.3 F (36.8 C) (Temporal)   Ht 5' 10.5" (1.791 m)   Wt 189 lb 6.4 oz (85.9 kg)   SpO2 96%   BMI 26.79 kg/m    Physical Exam Vitals and nursing note reviewed.  Constitutional:      General: He is not in acute distress.    Appearance: He is not ill-appearing, toxic-appearing or diaphoretic.  HENT:     Mouth/Throat:     Mouth: No angioedema.  Pulmonary:     Effort: Pulmonary effort is normal. No respiratory distress.  Skin:    General: Skin is warm and dry.     Findings: Rash present. Rash is vesicular.     Comments: Vesicular rash to left side of posterior neck and upper back across posterior left shoulder. Does not cross the midline. Picture below.   Neurological:     Mental Status: He is alert and oriented to person, place, and time. Mental status is at baseline.  Psychiatric:        Mood and Affect: Mood normal.        Behavior: Behavior normal.     No results found for any visits on  08/27/23.      Assessment & Plan:   [name] was seen today for rash.  Diagnoses and all orders for this visit:  Herpes zoster without complication Valtrex as below. Discussed symptomatic care and return precautions.  -     valACYclovir (VALTREX) 1000 MG tablet; Take 1 tablet (1,000 mg total) by mouth 3 (three) times daily for 10 days.  The patient indicates understanding of these issues and agrees with the plan.  Albertha Huger, FNP

## 2023-10-05 DIAGNOSIS — H401131 Primary open-angle glaucoma, bilateral, mild stage: Secondary | ICD-10-CM | POA: Diagnosis not present

## 2023-10-05 DIAGNOSIS — H02831 Dermatochalasis of right upper eyelid: Secondary | ICD-10-CM | POA: Diagnosis not present

## 2023-10-05 DIAGNOSIS — H35371 Puckering of macula, right eye: Secondary | ICD-10-CM | POA: Diagnosis not present

## 2023-10-05 DIAGNOSIS — H02834 Dermatochalasis of left upper eyelid: Secondary | ICD-10-CM | POA: Diagnosis not present

## 2023-10-22 ENCOUNTER — Other Ambulatory Visit: Payer: Self-pay | Admitting: Family Medicine

## 2023-10-22 DIAGNOSIS — I10 Essential (primary) hypertension: Secondary | ICD-10-CM

## 2023-12-20 ENCOUNTER — Other Ambulatory Visit: Payer: Self-pay | Admitting: Family Medicine

## 2023-12-22 NOTE — Telephone Encounter (Signed)
 Appt scheduled for 02/01/2024

## 2023-12-22 NOTE — Telephone Encounter (Signed)
Stacks NTBS in Oct for 6 mos FU RF sent to pharmacy

## 2024-01-01 ENCOUNTER — Encounter: Payer: Self-pay | Admitting: Diagnostic Neuroimaging

## 2024-01-10 ENCOUNTER — Encounter: Payer: Self-pay | Admitting: Family Medicine

## 2024-01-12 ENCOUNTER — Ambulatory Visit: Admitting: Family Medicine

## 2024-01-12 ENCOUNTER — Other Ambulatory Visit: Payer: Self-pay | Admitting: Family Medicine

## 2024-01-12 ENCOUNTER — Encounter: Payer: Self-pay | Admitting: Family Medicine

## 2024-01-12 ENCOUNTER — Telehealth: Payer: Self-pay | Admitting: *Deleted

## 2024-01-12 DIAGNOSIS — E782 Mixed hyperlipidemia: Secondary | ICD-10-CM

## 2024-01-12 NOTE — Telephone Encounter (Signed)
 Spoke to patients daughter. Informed that Dr. Zollie called in sick and that we would need to reschedule.  She said she drove all the way up from [place] to take him to his appointment. She said we need to get him on the schedule with another provider today.  I informed that his appointment is a 6 month chronic condition follow up and it is clinic policy that he be seen by his PCP.   She said our clinic is horrible and they are going to find a new practice.

## 2024-01-12 NOTE — Telephone Encounter (Signed)
 I have removed Dr. Zollie as PCP.   Copied from CRM #[id number]. Topic: Appointments - Transfer of Care >> Jan 12, 2024 10:44 AM Graeme ORN wrote: Pt is requesting to transfer FROM: [facility] Pt is requesting to transfer TO: [facility] Reason for requested transfer: closer to home, lives in [place] - daughter/ POA comes from [place] to bring him It is the responsibility of the team the patient would like to transfer to (Dr. Zollie) to reach out to the patient if for any reason this transfer is not acceptable.   ----------------------------------------------------------------------- From previous Reason for Contact - Scheduling: Patient/patient representative is calling to schedule an appointment. Refer to attachments for appointment information.

## 2024-01-19 ENCOUNTER — Other Ambulatory Visit: Payer: Self-pay

## 2024-01-19 ENCOUNTER — Emergency Department (HOSPITAL_BASED_OUTPATIENT_CLINIC_OR_DEPARTMENT_OTHER): Admitting: Radiology

## 2024-01-19 ENCOUNTER — Emergency Department (HOSPITAL_BASED_OUTPATIENT_CLINIC_OR_DEPARTMENT_OTHER)
Admission: EM | Admit: 2024-01-19 | Discharge: 2024-01-19 | Discharge status: Against Medical Advice | Attending: Emergency Medicine | Admitting: Emergency Medicine

## 2024-01-19 DIAGNOSIS — S6991XA Unspecified injury of right wrist, hand and finger(s), initial encounter: Secondary | ICD-10-CM | POA: Diagnosis not present

## 2024-01-19 DIAGNOSIS — M7989 Other specified soft tissue disorders: Secondary | ICD-10-CM | POA: Diagnosis not present

## 2024-01-19 DIAGNOSIS — W208XXA Other cause of strike by thrown, projected or falling object, initial encounter: Secondary | ICD-10-CM | POA: Diagnosis not present

## 2024-01-19 DIAGNOSIS — Z5321 Procedure and treatment not carried out due to patient leaving prior to being seen by health care provider: Secondary | ICD-10-CM | POA: Diagnosis not present

## 2024-01-19 DIAGNOSIS — M1811 Unilateral primary osteoarthritis of first carpometacarpal joint, right hand: Secondary | ICD-10-CM | POA: Diagnosis not present

## 2024-01-19 NOTE — ED Triage Notes (Signed)
 Patient states dropped a heavy piece of metal on right index finger 3 days ago. Continued swelling.

## 2024-01-29 ENCOUNTER — Other Ambulatory Visit: Payer: Self-pay

## 2024-01-29 DIAGNOSIS — I10 Essential (primary) hypertension: Secondary | ICD-10-CM

## 2024-02-01 ENCOUNTER — Ambulatory Visit: Admitting: Family Medicine

## 2024-02-09 ENCOUNTER — Encounter: Payer: Self-pay | Admitting: Sports Medicine

## 2024-02-10 ENCOUNTER — Encounter: Payer: Self-pay | Admitting: Sports Medicine

## 2024-02-10 ENCOUNTER — Ambulatory Visit (INDEPENDENT_AMBULATORY_CARE_PROVIDER_SITE_OTHER): Admitting: Sports Medicine

## 2024-02-10 VITALS — BP 136/78 | HR 62 | Temp 98.3°F | Resp 14 | Ht 70.5 in | Wt 189.8 lb

## 2024-02-10 DIAGNOSIS — I10 Essential (primary) hypertension: Secondary | ICD-10-CM | POA: Diagnosis not present

## 2024-02-10 DIAGNOSIS — F0390 Unspecified dementia without behavioral disturbance: Secondary | ICD-10-CM | POA: Diagnosis not present

## 2024-02-10 DIAGNOSIS — J302 Other seasonal allergic rhinitis: Secondary | ICD-10-CM | POA: Diagnosis not present

## 2024-02-10 DIAGNOSIS — E785 Hyperlipidemia, unspecified: Secondary | ICD-10-CM

## 2024-02-10 NOTE — Progress Notes (Signed)
 Careteam: Patient Care Team: Sherlynn Madden, MD as PCP - General (Internal Medicine) Mollie Greig PARAS, MD (Dermatology) Vicci Mcardle, OD (Optometry) Margaret, Eduard SAUNDERS, MD as Consulting Physician (Neurology)  PLACE OF SERVICE:  [facility]  Advanced Directive information    No Known Allergies  No chief complaint on file.       History of Present Illness David Guerrero is an 83 year old male with dementia who presents to establish care. He is accompanied by his daughter.  Over the past few months, he has experienced increased confusion and memory issues. He wakes up at night, often around 2:30 AM, believing it is time to start the day, and has been found dressed and ready to leave the house. This behavior occurs regularly, raising concerns about his safety, particularly the potential for wandering. He forgets names, including those of his grandchildren, and has poor short-term memory, often not recalling recent events. He sometimes writes things down to help remember. Recently, he has reported seeing his deceased parents .  He resides in [place] but requires more support due to his increasing needs. He is capable of performing personal tasks like dressing himself but needs assistance with day-to-day tasks such as meal preparation and medication management. He has been taking his medications inconsistently, sometimes taking all his pills at once due to confusion between morning and night. He is currently on amlodipine  7.5 mg for blood pressure and donepezil  for dementia, which he has been on for about two years.  He has lost some weight over the past couple of years, which is concerning to his daughter, although his appetite remains good. He has a history of being physically active and maintains a good exercise routine. He has no issues with balance and has not experienced any falls. 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  In terms of social history, he is a former Chemical engineer and has always been health-conscious, maintaining a high-protein diet and avoiding smoking and excessive drinking. He recently gave up driving, gifting his car to his granddaughter. He is very social and enjoys being around people, which is a consideration in his move to an assisted living facility closer to his daughter in [place].    No falls No balance problems     Review of Systems:  Review of Systems  Constitutional:  Negative for chills and fever.  HENT:  Negative for congestion and sore throat.   Respiratory:  Negative for cough, sputum production and shortness of breath.   Cardiovascular:  Negative for chest pain, palpitations and leg swelling.  Gastrointestinal:  Negative for abdominal pain, heartburn and nausea.  Genitourinary:  Negative for dysuria, frequency and hematuria.  Musculoskeletal:  Negative for falls and myalgias.  Neurological:  Negative for dizziness.  Psychiatric/Behavioral:  Positive for hallucinations and memory loss.    Negative unless indicated in HPI.   Past Medical History:  Diagnosis Date   Cancer (HCC)    melanoma, removed apprx 2013, early stage per pt   Cataract 12/14/2008   Hyperlipidemia    Memory deficit    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   Past Surgical History:  Procedure Laterality Date   EYE SURGERY  2010   Cataracts   MELANOMA EXCISION     L upper arm   Social History:   reports that he quit smoking about 43 years ago. His smoking use included cigarettes. He started smoking about 53 years ago.  He has a 5 pack-year smoking history. He has never used smokeless tobacco. He reports current alcohol use of about 2.0 - 3.0 standard drinks of alcohol per week. He reports that he does not use drugs.  Family History  Problem Relation Age of Onset   Arthritis Mother    Cancer Father        lung    Medications: Patient's Medications  New  Prescriptions   No medications on file  Previous Medications   AMLODIPINE  (NORVASC ) 5 MG TABLET    TAKE 1 AND 1/2 TABLETS EVERY DAY   ATORVASTATIN  (LIPITOR) 40 MG TABLET    Take 1 tablet (40 mg total) by mouth daily. For cholesterol   CELECOXIB  (CELEBREX ) 200 MG CAPSULE    TAKE 1 CAPSULE EVERY DAY WITH FOOD   CYANOCOBALAMIN  (VITAMIN B 12 PO)    Take by mouth.   DONEPEZIL  (ARICEPT ) 10 MG TABLET    Take 1 tablet (10 mg total) by mouth at bedtime.   FEXOFENADINE  (ALLEGRA ) 180 MG TABLET    Take 1 tablet (180 mg total) by mouth daily. For allergy symptoms   MULTIPLE VITAMIN (MULTIVITAMIN WITH MINERALS) TABS TABLET    Take 1 tablet by mouth daily.   OVER THE COUNTER MEDICATION    Cream for arthritis pain  Modified Medications   No medications on file  Discontinued Medications   No medications on file    Physical Exam: There were no vitals filed for this visit. There is no height or weight on file to calculate BMI. BP Readings from Last 3 Encounters:  01/19/24 (!) 149/73  08/27/23 135/65  07/28/23 (!) 157/68   Wt Readings from Last 3 Encounters:  08/27/23 189 lb 6.4 oz (85.9 kg)  07/28/23 189 lb (85.7 kg)  07/14/23 189 lb (85.7 kg)    Physical Exam Constitutional:      Appearance: Normal appearance.  HENT:     Head: Normocephalic and atraumatic.  Cardiovascular:     Rate and Rhythm: Normal rate and regular rhythm.     Pulses: Normal pulses.     Heart sounds: Normal heart sounds.  Pulmonary:     Effort: No respiratory distress.     Breath sounds: No stridor. No wheezing or rales.  Abdominal:     General: Bowel sounds are normal. There is no distension.     Palpations: Abdomen is soft.     Tenderness: There is no abdominal tenderness. There is no guarding.  Musculoskeletal:        General: No swelling.  Neurological:     Mental Status: He is alert. Mental status is at baseline.     Sensory: No sensory deficit.     Motor: No weakness.     Labs reviewed: Basic Metabolic  Panel: Recent Labs    04/29/23 1024  NA 143  K 3.8  CL 106  CO2 24  GLUCOSE 93  BUN 16  CREATININE 1.13  CALCIUM  9.5   Liver Function Tests: Recent Labs    04/29/23 1024  AST 17  ALT 13  ALKPHOS 69  BILITOT 0.8  PROT 6.2  ALBUMIN 4.1   No results for input(s): LIPASE, AMYLASE in the last 8760 hours. No results for input(s): AMMONIA in the last 8760 hours. CBC: Recent Labs    04/29/23 1024  WBC 4.8  NEUTROABS 3.3  HGB 14.5  HCT 44.1  MCV 96  PLT 162   Lipid Panel: Recent Labs    04/29/23 1024  CHOL 141  HDL 63  LDLCALC 57  TRIG 121  CHOLHDL 2.2   TSH: No results for input(s): TSH in the last 8760 hours. A1C: No results found for: HGBA1C  Assessment and Plan Assessment & Plan   1. Major neurocognitive disorder (HCC) (Primary)  [place] 9/30  Cont with aricept   Daughter planning to move him to assisted living in [place] and brought the paper work  - QuantiFERON-TB Gold Plus  2. Primary hypertension  At goal  Cont with amlodipine  5 mg daily  3. Hyperlipidemia, unspecified hyperlipidemia type  Cont with lipitor   4. Seasonal allergies  Cont with allegra 

## 2024-02-13 LAB — QUANTIFERON-TB GOLD PLUS
Mitogen-NIL: 9.22 [IU]/mL
NIL: 0.02 [IU]/mL
QuantiFERON-TB Gold Plus: NEGATIVE
TB1-NIL: 0.01 [IU]/mL
TB2-NIL: 0.01 [IU]/mL

## 2024-02-15 ENCOUNTER — Ambulatory Visit: Payer: Self-pay | Admitting: Sports Medicine

## 2024-02-17 ENCOUNTER — Encounter: Payer: Self-pay | Admitting: Sports Medicine

## 2024-02-17 NOTE — Telephone Encounter (Signed)
 Contacted patients daughter regarding forms being mailed out. Meant to contact patients daughter yesterday. Spoke with patients daughter and confirmed the address it was sent to. Informed her that the mailman came today and picked up the mail.

## 2024-03-03 ENCOUNTER — Telehealth: Payer: Self-pay

## 2024-03-03 NOTE — Telephone Encounter (Signed)
 Gordy with [facility] called to see if there is a specific brand of arthritis cream patient is suppose to use. Gordy was informed that according to his record a brand is not specified.   Gordy then asked the frequency of cream and I informed Gordy that I will need to find out from the patient who he plans to see in the future as Sherlynn Madden, MD is no longer at this location. Once new PCP confirmed I can send a message to inquire about frequency. Gordy then said, never mind I see in the record that patient only uses once daily.   Outgoing call was placed to patients daughter, Delon requesting a return call to update PCP

## 2024-03-18 ENCOUNTER — Encounter: Payer: Self-pay | Admitting: Sports Medicine

## 2024-03-19 ENCOUNTER — Encounter: Payer: Self-pay | Admitting: Sports Medicine

## 2024-03-21 ENCOUNTER — Telehealth: Payer: Self-pay | Admitting: *Deleted

## 2024-03-21 NOTE — Telephone Encounter (Signed)
 Copied from CRM [id number]. Topic: Clinical - Medical Advice >> Mar 21, 2024 11:12 AM Rea ORN wrote: Reason for CRM: Pt daughter Delon called to advise that she needs a FL2 form filled out asap for pt. She stated he is currently at [facility] in [place], [place] and had a fall. Delon stated she needs to move him to more skilled facility now. She currently had 24 hour sitters with him but that will end on Wednesday. Delon can not bring the pt into the clinic due to his current location and is asking PCP to fill out the [facility] form based on his last visit with her. Delon said this is a pt safety issue and all of this happened very fast since moving him to [facility]. She stated his decline has been rapid and since the move and they have yet to secure a new PCP for him there.   Please call Delon back [phone] >> Mar 21, 2024  3:39 PM Alfonso ORN wrote: Patient daughter Japheth Diekman calling back on status need the fL2 form completed soon as possible , patient needing higher level of care and can not get it until the [facility] form is completed  The fax number need to fax the [facility] form to  the [phone]  Attention Delon Bucks

## 2024-03-21 NOTE — Telephone Encounter (Signed)
 I have not see patient in the past and since this is an acute issue and patient is in [place] ,I would recommend taking patient for evaluation first for Fall then hospital will determine if need to be discharged to Skilled Nursing rehab.will need to be checked for possible urinary tract infection which usually worsen mental status and falls for patient with memory issues.

## 2024-03-21 NOTE — Telephone Encounter (Signed)
 Sending to in office providers to advise on who would be willing to complete form.

## 2024-03-21 NOTE — Telephone Encounter (Signed)
 Patient needs to be seen in the emergency room for evaluation and possibly help with local long term care placement.

## 2024-03-21 NOTE — Telephone Encounter (Signed)
 Copied from CRM [id number]. Topic: Clinical - Medical Advice >> Mar 21, 2024 11:12 AM Rea ORN wrote: Reason for CRM: Pt daughter David Guerrero called to advise that she needs a FL2 form filled out asap for pt. She stated he is currently at [facility] in [place], [place] and had a fall. David Guerrero stated she needs to move him to more skilled facility now. She currently had 24 hour sitters with him but that will end on Wednesday. David Guerrero can not bring the pt into the clinic due to his current location and is asking PCP to fill out the [facility] form based on his last visit with her. David Guerrero said this is a pt safety issue and all of this happened very fast since moving him to [facility]. She stated his decline has been rapid and since the move and they have yet to secure a new PCP for him there.   Please call David Guerrero back [phone]

## 2024-07-15 ENCOUNTER — Ambulatory Visit

## 2024-07-20 ENCOUNTER — Ambulatory Visit
